# Patient Record
Sex: Male | Born: 1985 | Race: Black or African American | Hispanic: No | Marital: Single | Smoking: Never smoker
Health system: Southern US, Community
[De-identification: ages and names within clinical notes are randomized; demographics above are authoritative.]

---

## 2018-08-20 ENCOUNTER — Encounter (HOSPITAL_COMMUNITY): Payer: Self-pay

## 2018-08-20 ENCOUNTER — Inpatient Hospital Stay (HOSPITAL_COMMUNITY)
Admission: EM | Admit: 2018-08-20 | Discharge: 2018-08-23 | DRG: 194 | Disposition: A | Discharge status: Other Acute Inpatient Hospital | Payer: BC Managed Care – PPO | Attending: Internal Medicine | Admitting: Internal Medicine

## 2018-08-20 ENCOUNTER — Emergency Department (HOSPITAL_COMMUNITY): Payer: BC Managed Care – PPO

## 2018-08-20 ENCOUNTER — Other Ambulatory Visit: Payer: Self-pay

## 2018-08-20 DIAGNOSIS — F4323 Adjustment disorder with mixed anxiety and depressed mood: Secondary | ICD-10-CM | POA: Diagnosis present

## 2018-08-20 DIAGNOSIS — F329 Major depressive disorder, single episode, unspecified: Secondary | ICD-10-CM | POA: Diagnosis present

## 2018-08-20 DIAGNOSIS — W1789XA Other fall from one level to another, initial encounter: Secondary | ICD-10-CM

## 2018-08-20 DIAGNOSIS — J159 Unspecified bacterial pneumonia: Principal | ICD-10-CM | POA: Diagnosis present

## 2018-08-20 DIAGNOSIS — Z1159 Encounter for screening for other viral diseases: Secondary | ICD-10-CM

## 2018-08-20 DIAGNOSIS — F23 Brief psychotic disorder: Secondary | ICD-10-CM | POA: Diagnosis present

## 2018-08-20 DIAGNOSIS — F29 Unspecified psychosis not due to a substance or known physiological condition: Secondary | ICD-10-CM

## 2018-08-20 DIAGNOSIS — Y92008 Other place in unspecified non-institutional (private) residence as the place of occurrence of the external cause: Secondary | ICD-10-CM

## 2018-08-20 DIAGNOSIS — G4700 Insomnia, unspecified: Secondary | ICD-10-CM | POA: Diagnosis present

## 2018-08-20 DIAGNOSIS — J939 Pneumothorax, unspecified: Secondary | ICD-10-CM | POA: Diagnosis present

## 2018-08-20 DIAGNOSIS — J982 Interstitial emphysema: Secondary | ICD-10-CM

## 2018-08-20 DIAGNOSIS — J189 Pneumonia, unspecified organism: Secondary | ICD-10-CM

## 2018-08-20 DIAGNOSIS — T1491XA Suicide attempt, initial encounter: Secondary | ICD-10-CM | POA: Diagnosis present

## 2018-08-20 DIAGNOSIS — F410 Panic disorder [episodic paroxysmal anxiety] without agoraphobia: Secondary | ICD-10-CM | POA: Diagnosis present

## 2018-08-20 DIAGNOSIS — X80XXXA Intentional self-harm by jumping from a high place, initial encounter: Secondary | ICD-10-CM | POA: Diagnosis present

## 2018-08-20 LAB — URINALYSIS, ROUTINE W REFLEX MICROSCOPIC
Bacteria, UA: NONE SEEN
Bilirubin Urine: NEGATIVE
Glucose, UA: NEGATIVE mg/dL
Ketones, ur: 5 mg/dL — AB
Leukocytes,Ua: NEGATIVE
Nitrite: NEGATIVE
Protein, ur: 30 mg/dL — AB
Specific Gravity, Urine: >1.046 — ABNORMAL HIGH (ref 1.005–1.030)
pH: 6 (ref 5.0–8.0)

## 2018-08-20 LAB — COMPREHENSIVE METABOLIC PANEL
ALT: 75 U/L — ABNORMAL HIGH (ref 0–44)
AST: 59 U/L — ABNORMAL HIGH (ref 15–41)
Albumin: 3.3 g/dL — ABNORMAL LOW (ref 3.5–5.0)
Alkaline Phosphatase: 52 U/L (ref 38–126)
Anion gap: 14 (ref 5–15)
BUN: 12 mg/dL (ref 6–20)
CO2: 20 mmol/L — ABNORMAL LOW (ref 22–32)
Calcium: 8.6 mg/dL — ABNORMAL LOW (ref 8.9–10.3)
Chloride: 101 mmol/L (ref 98–111)
Creatinine, Ser: 1.24 mg/dL (ref 0.61–1.24)
GFR calc Af Amer: >60 mL/min (ref >60)
GFR calc non Af Amer: >60 mL/min (ref >60)
Glucose, Bld: 131 mg/dL — ABNORMAL HIGH (ref 70–99)
Potassium: 3.7 mmol/L (ref 3.5–5.1)
Sodium: 135 mmol/L (ref 135–145)
Total Bilirubin: 2.9 mg/dL — ABNORMAL HIGH (ref 0.3–1.2)
Total Protein: 7.1 g/dL (ref 6.5–8.1)

## 2018-08-20 LAB — I-STAT CHEM 8, ED
BUN: 14 mg/dL (ref 6–20)
Calcium, Ion: 0.96 mmol/L — ABNORMAL LOW (ref 1.15–1.40)
Chloride: 103 mmol/L (ref 98–111)
Creatinine, Ser: 1.1 mg/dL (ref 0.61–1.24)
Glucose, Bld: 132 mg/dL — ABNORMAL HIGH (ref 70–99)
HCT: 35 % — ABNORMAL LOW (ref 39.0–52.0)
Hemoglobin: 11.9 g/dL — ABNORMAL LOW (ref 13.0–17.0)
Potassium: 3.6 mmol/L (ref 3.5–5.1)
Sodium: 134 mmol/L — ABNORMAL LOW (ref 135–145)
TCO2: 22 mmol/L (ref 22–32)

## 2018-08-20 LAB — RAPID HIV SCREEN (HIV 1/2 AB+AG)
HIV 1/2 Antibodies: NONREACTIVE
HIV-1 P24 Antigen - HIV24: NONREACTIVE

## 2018-08-20 LAB — PROTIME-INR
INR: 1.2 (ref 0.8–1.2)
Prothrombin Time: 15 seconds (ref 11.4–15.2)

## 2018-08-20 LAB — CBC
HCT: 32.9 % — ABNORMAL LOW (ref 39.0–52.0)
Hemoglobin: 10.6 g/dL — ABNORMAL LOW (ref 13.0–17.0)
MCH: 23.5 pg — ABNORMAL LOW (ref 26.0–34.0)
MCHC: 32.2 g/dL (ref 30.0–36.0)
MCV: 72.9 fL — ABNORMAL LOW (ref 80.0–100.0)
Platelets: 365 10*3/uL (ref 150–400)
RBC: 4.51 MIL/uL (ref 4.22–5.81)
RDW: 12.2 % (ref 11.5–15.5)
WBC: 12.7 10*3/uL — ABNORMAL HIGH (ref 4.0–10.5)
nRBC: 0 % (ref 0.0–0.2)

## 2018-08-20 LAB — SAMPLE TO BLOOD BANK

## 2018-08-20 LAB — RAPID URINE DRUG SCREEN, HOSP PERFORMED
Amphetamines: NOT DETECTED
Barbiturates: NOT DETECTED
Benzodiazepines: POSITIVE — AB
Cocaine: NOT DETECTED
Opiates: NOT DETECTED
Tetrahydrocannabinol: NOT DETECTED

## 2018-08-20 LAB — LACTIC ACID, PLASMA: Lactic Acid, Venous: 1.7 mmol/L (ref 0.5–1.9)

## 2018-08-20 LAB — ETHANOL: Alcohol, Ethyl (B): <10 mg/dL (ref <10)

## 2018-08-20 LAB — ACETAMINOPHEN LEVEL: Acetaminophen (Tylenol), Serum: <10 ug/mL — ABNORMAL LOW (ref 10–30)

## 2018-08-20 LAB — SALICYLATE LEVEL: Salicylate Lvl: <7 mg/dL (ref 2.8–30.0)

## 2018-08-20 LAB — SARS CORONAVIRUS 2 BY RT PCR (HOSPITAL ORDER, PERFORMED IN ~~LOC~~ HOSPITAL LAB): SARS Coronavirus 2: NEGATIVE

## 2018-08-20 MED ORDER — SODIUM CHLORIDE 0.9 % IV SOLN
1.0000 g | Freq: Once | INTRAVENOUS | Status: AC
  Administered 2018-08-20: 1 g via INTRAVENOUS
  Filled 2018-08-20: qty 10

## 2018-08-20 MED ORDER — SODIUM CHLORIDE 0.9 % IV SOLN
500.0000 mg | Freq: Once | INTRAVENOUS | Status: AC
  Administered 2018-08-20: 500 mg via INTRAVENOUS
  Filled 2018-08-20: qty 500

## 2018-08-20 MED ORDER — ENOXAPARIN SODIUM 40 MG/0.4ML ~~LOC~~ SOLN
40.0000 mg | SUBCUTANEOUS | Status: DC
  Administered 2018-08-22: 40 mg via SUBCUTANEOUS
  Filled 2018-08-20: qty 0.4

## 2018-08-20 MED ORDER — SODIUM CHLORIDE 0.9 % IV BOLUS
1000.0000 mL | Freq: Once | INTRAVENOUS | Status: AC
  Administered 2018-08-20: 1000 mL via INTRAVENOUS

## 2018-08-20 MED ORDER — IOHEXOL 300 MG/ML  SOLN
100.0000 mL | Freq: Once | INTRAMUSCULAR | Status: AC | PRN
  Administered 2018-08-20: 100 mL via INTRAVENOUS

## 2018-08-20 MED ORDER — ACETAMINOPHEN 650 MG RE SUPP: 650.0000 mg | Freq: Four times a day (QID) | RECTAL | Status: DC | PRN

## 2018-08-20 MED ORDER — STERILE WATER FOR INJECTION IJ SOLN
INTRAMUSCULAR | Status: AC
  Administered 2018-08-20: 1 mL
  Filled 2018-08-20: qty 10

## 2018-08-20 MED ORDER — ZIPRASIDONE MESYLATE 20 MG IM SOLR
20.0000 mg | Freq: Once | INTRAMUSCULAR | Status: AC
  Administered 2018-08-20: 20 mg via INTRAMUSCULAR
  Filled 2018-08-20: qty 20

## 2018-08-20 MED ORDER — DIPHENHYDRAMINE HCL 25 MG PO TABS: 50.0000 mg | ORAL_TABLET | Freq: Every evening | ORAL | Status: DC | PRN

## 2018-08-20 MED ORDER — SODIUM CHLORIDE 0.9% FLUSH
3.0000 mL | Freq: Two times a day (BID) | INTRAVENOUS | Status: DC
  Administered 2018-08-20 – 2018-08-22 (×4): 3 mL via INTRAVENOUS

## 2018-08-20 MED ORDER — ACETAMINOPHEN 325 MG PO TABS: 650.0000 mg | ORAL_TABLET | Freq: Four times a day (QID) | ORAL | Status: DC | PRN

## 2018-08-20 NOTE — ED Notes (Signed)
Please call mom Tayvien Kane at [phone] to give her an update on patient

## 2018-08-20 NOTE — ED Notes (Signed)
Called staffing for sitter. Kennon Holter is said to arrive around 1900

## 2018-08-20 NOTE — ED Notes (Signed)
ED TO INPATIENT HANDOFF REPORT  ED Nurse Name and Phone #: [phone]  S Name/Age/Gender Richard [name] 33 y.o. male Room/Bed: TRAAC/TRAAC  Code Status   Code Status: Full Code  Home/SNF/Other Home Patient oriented to: self, place, time and situation Is this baseline? Yes   Triage Complete: Triage complete  Chief Complaint Level 2  Triage Note No notes on file   Allergies No Known Allergies  Level of Care/Admitting Diagnosis ED Disposition    ED Disposition Condition Comment   Admit  Hospital Area: [facility] [[id number]]  Level of Care: Telemetry Medical [104]  Covid Evaluation: Confirmed COVID Negative  Diagnosis: Pneumonia [227785]  Admitting Physician: Inez Catalina [[id number]]  Attending Physician: Inez Catalina [[id number]]  PT Class (Do Not Modify): Observation [104]  PT Acc Code (Do Not Modify): Observation [10022]       B Medical/Surgery History No past medical history on file.    A IV Location/Drains/Wounds Patient Lines/Drains/Airways Status   Active Line/Drains/Airways    Name:   Placement date:   Placement time:   Site:   Days:   Peripheral IV 08/20/18 Left Hand   08/20/18    -    Hand   less than 1          Intake/Output Last 24 hours  Intake/Output Summary (Last 24 hours) at 08/20/2018 1930 Last data filed at 08/20/2018 1918 Gross per 24 hour  Intake 100 ml  Output 250 ml  Net -150 ml    Labs/Imaging Results for orders placed or performed during the hospital encounter of 08/20/18 (from the past 48 hour(s))  Comprehensive metabolic panel     Status: Abnormal   Collection Time: 08/20/18 12:44 PM  Result Value Ref Range   Sodium 135 135 - 145 mmol/L   Potassium 3.7 3.5 - 5.1 mmol/L   Chloride 101 98 - 111 mmol/L   CO2 20 (L) 22 - 32 mmol/L   Glucose, Bld 131 (H) 70 - 99 mg/dL   BUN 12 6 - 20 mg/dL   Creatinine, Ser 1.91 0.61 - 1.24 mg/dL   Calcium 8.6 (L) 8.9 - 10.3 mg/dL   Total Protein 7.1 6.5 - 8.1 g/dL   Albumin 3.3  (L) 3.5 - 5.0 g/dL   AST 59 (H) 15 - 41 U/L   ALT 75 (H) 0 - 44 U/L   Alkaline Phosphatase 52 38 - 126 U/L   Total Bilirubin 2.9 (H) 0.3 - 1.2 mg/dL   GFR calc non Af Amer >60 >60 mL/min   GFR calc Af Amer >60 >60 mL/min   Anion gap 14 5 - 15    Comment: Performed at [facility], [address] [place], [place] [address]  CBC     Status: Abnormal   Collection Time: 08/20/18 12:44 PM  Result Value Ref Range   WBC 12.7 (H) 4.0 - 10.5 K/uL   RBC 4.51 4.22 - 5.81 MIL/uL   Hemoglobin 10.6 (L) 13.0 - 17.0 g/dL   HCT 56.2 (L) 13.0 - 86.5 %   MCV 72.9 (L) 80.0 - 100.0 fL   MCH 23.5 (L) 26.0 - 34.0 pg   MCHC 32.2 30.0 - 36.0 g/dL   RDW 78.4 69.6 - 29.5 %   Platelets 365 150 - 400 K/uL   nRBC 0.0 0.0 - 0.2 %    Comment: Performed at [facility], [address], [place], [place] [address]  Ethanol     Status: None  Collection Time: 08/20/18 12:44 PM  Result Value Ref Range   Alcohol, Ethyl (B) <10 <10 mg/dL    Comment: (NOTE) Lowest detectable limit for serum alcohol is 10 mg/dL. For medical purposes only. Performed at [facility], [address] [place], [place] [address]   Lactic acid, plasma     Status: None   Collection Time: 08/20/18 12:44 PM  Result Value Ref Range   Lactic Acid, Venous 1.7 0.5 - 1.9 mmol/L    Comment: Performed at [facility], [address] [place], [place] [address]  Protime-INR     Status: None   Collection Time: 08/20/18 12:44 PM  Result Value Ref Range   Prothrombin Time 15.0 11.4 - 15.2 seconds   INR 1.2 0.8 - 1.2    Comment: (NOTE) INR goal varies based on device and disease states. Performed at [facility], [address] [place], [place] [address]   Sample to Blood Bank     Status: None   Collection Time: 08/20/18 12:45 PM  Result Value Ref Range   Blood Bank Specimen SAMPLE AVAILABLE FOR TESTING    Sample Expiration      08/21/2018,2359 Performed at [facility], [address]  [place], [place] [address]   I-stat chem 8, ED     Status: Abnormal   Collection Time: 08/20/18 12:51 PM  Result Value Ref Range   Sodium 134 (L) 135 - 145 mmol/L   Potassium 3.6 3.5 - 5.1 mmol/L   Chloride 103 98 - 111 mmol/L   BUN 14 6 - 20 mg/dL    Comment: QA FLAGS AND/OR RANGES MODIFIED BY DEMOGRAPHIC UPDATE ON 07/17 AT 1324   Creatinine, Ser 1.10 0.61 - 1.24 mg/dL   Glucose, Bld 440132 (H) 70 - 99 mg/dL   Calcium, Ion 1.020.96 (L) 1.15 - 1.40 mmol/L   TCO2 22 22 - 32 mmol/L   Hemoglobin 11.9 (L) 13.0 - 17.0 g/dL   HCT 72.535.0 (L) 36.639.0 - 44.052.0 %  Acetaminophen level     Status: Abnormal   Collection Time: 08/20/18  3:06 PM  Result Value Ref Range   Acetaminophen (Tylenol), Serum <10 (L) 10 - 30 ug/mL    Comment: (NOTE) Therapeutic concentrations vary significantly. A range of 10-30 ug/mL  may be an effective concentration for many patients. However, some  are best treated at concentrations outside of this range. Acetaminophen concentrations >150 ug/mL at 4 hours after ingestion  and >50 ug/mL at 12 hours after ingestion are often associated with  toxic reactions. Performed at [facility], [address] [place], [place] [address]   Salicylate level     Status: None   Collection Time: 08/20/18  3:06 PM  Result Value Ref Range   Salicylate Lvl <7.0 2.8 - 30.0 mg/dL    Comment: Performed at [facility], [address] [place], [place] [address]  SARS Coronavirus 2 (CEPHEID - Performed in [facility] lab), Hosp Order     Status: None   Collection Time: 08/20/18  4:16 PM   Specimen: Nasopharyngeal Swab  Result Value Ref Range   SARS Coronavirus 2 NEGATIVE NEGATIVE    Comment: (NOTE) If result is NEGATIVE SARS-CoV-2 target nucleic acids are NOT DETECTED. The SARS-CoV-2 RNA is generally detectable in upper and lower  respiratory specimens during the acute phase of infection. The lowest  concentration of SARS-CoV-2 viral copies this assay can detect is 250  copies /  mL. A negative result does not preclude SARS-CoV-2 infection  and should not be used as the sole basis for treatment or other  patient management decisions.  A negative result may occur with  improper specimen collection / handling, submission of specimen other  than nasopharyngeal swab, presence of viral mutation(s) within the  areas targeted by this assay, and inadequate number of viral copies  (<250 copies / mL). A negative result must be combined with clinical  observations, patient history, and epidemiological information. If result is POSITIVE SARS-CoV-2 target nucleic acids are DETECTED. The SARS-CoV-2 RNA is generally detectable in upper and lower  respiratory specimens dur ing the acute phase of infection.  Positive  results are indicative of active infection with SARS-CoV-2.  Clinical  correlation with patient history and other diagnostic information is  necessary to determine patient infection status.  Positive results do  not rule out bacterial infection or co-infection with other viruses. If result is PRESUMPTIVE POSTIVE SARS-CoV-2 nucleic acids MAY BE PRESENT.   A presumptive positive result was obtained on the submitted specimen  and confirmed on repeat testing.  While 2019 novel coronavirus  (SARS-CoV-2) nucleic acids may be present in the submitted sample  additional confirmatory testing may be necessary for epidemiological  and / or clinical management purposes  to differentiate between  SARS-CoV-2 and other Sarbecovirus currently known to infect humans.  If clinically indicated additional testing with an alternate test  methodology [phone]) is advised. The SARS-CoV-2 RNA is generally  detectable in upper and lower respiratory sp ecimens during the acute  phase of infection. The expected result is Negative. Fact Sheet for Patients:  StrictlyIdeas.no Fact Sheet for Healthcare Providers: BankingDealers.co.za This test is  not yet approved or cleared by the [place] FDA and has been authorized for detection and/or diagnosis of SARS-CoV-2 by FDA under an Emergency Use Authorization (EUA).  This EUA will remain in effect (meaning this test can be used) for the duration of the COVID-19 declaration under Section 564(b)(1) of the Act, 21 U.S.C. section 360bbb-3(b)(1), unless the authorization is terminated or revoked sooner. Performed at [facility], [address], [place], [place] [address]    Ct Head Wo Contrast  Result Date: 08/20/2018 CLINICAL DATA:  Cervical spine injury, ligament injury suspected. Suicide attempt, fall from second story balcony EXAM: CT HEAD WITHOUT CONTRAST CT CERVICAL SPINE WITHOUT CONTRAST TECHNIQUE: Multidetector CT imaging of the head and cervical spine was performed following the standard protocol without intravenous contrast. Multiplanar CT image reconstructions of the cervical spine were also generated. COMPARISON:  None. FINDINGS: CT HEAD FINDINGS Brain: No evidence of acute infarction, hemorrhage, hydrocephalus, extra-axial collection or mass lesion/mass effect. Vascular: No hyperdense vessel or unexpected calcification. Skull: Prominent appearance of the lambdoid sutures is symmetric bilaterally without adjacent stranding or other features suggest diastasis. No definite calvarial fracture or suspicious osseous lesion. No scalp swelling or hematoma. Sinuses/Orbits: Nodular thickening right maxillary sinus paranasal sinuses and mastoid air cells are otherwise predominantly clear. And Orbital structures are unremarkable. Other: None None CT CERVICAL SPINE FINDINGS Alignment: Straightening of the normal cervical lordosis with slight reversal centered at C5. No traumatic listhesis. Skull base and vertebrae: No acute fracture. No primary bone lesion or focal pathologic process. Soft tissues and spinal canal: No pre or paravertebral fluid or swelling. No visible canal hematoma. Disc  levels: Disc spaces are well preserved. No significant spinal canal or foraminal stenosis. Upper chest: Scattered foci of upper mediastinal gas. No visible tracheal or  esophageal injury. Other: None. IMPRESSION: No acute intracranial abnormality. No calvarial fracture or scalp hematoma. No acute cervical spine fracture or traumatic listhesis. Cervical stabilization collar in place. Scattered foci of upper mediastinal gas, for further details see dedicated chest CT. Electronically Signed   By: Kreg ShropshirePrice  DeHay M.D.   On: 08/20/2018 15:13   Ct Chest W Contrast  Result Date: 08/20/2018 CLINICAL DATA:  Blunt abdominal trauma, suicide attempt, fall from second story balcony. Denies injuries, pain. EXAM: CT CHEST AND ABDOMEN WITH CONTRAST TECHNIQUE: Multidetector CT imaging of the chest and abdomen was performed following the standard protocol during bolus administration of intravenous contrast. CONTRAST:  100mL OMNIPAQUE IOHEXOL 300 MG/ML  SOLN COMPARISON:  None. FINDINGS: CT CHEST FINDINGS Cardiovascular: Evaluation of the aortic root is limited due to cardiac pulsation artifact. The aorta is normal caliber. No intramural hematoma, dissection flap or other luminal abnormality of the aorta is seen. No periaortic stranding or hemorrhage. Normal heart size. No pericardial effusion. Mediastinum/Nodes: Scattered foci of upper and middle mediastinal gas with air also seen tracking into the right hilum along the right mainstem bronchus. No abnormal esophageal thickening to suggest a site of esophageal injury. Thyroid is unremarkable. No enlarged mediastinal or axillary lymph nodes. Lungs/Pleura: Multifocal areas of airspace consolidation with a basilar and largely peripheral predominance. There is gas tracking along the right major fissure as well as a trace left pneumothorax seen medially as well. No pleural fluid. Musculoskeletal: No acute osseous abnormality or suspicious osseous lesion. Mild gynecomastia. No chest wall  contusion CT ABDOMEN FINDINGS Hepatobiliary: No focal liver abnormality is seen. No hepatic injury. No perihepatic hematoma. No gallstones, gallbladder wall thickening, or biliary dilatation. Pancreas: Unremarkable. No pancreatic ductal dilatation or surrounding inflammatory changes. Spleen: Normal in size without focal abnormality. No direct splenic injury or perisplenic hematoma. Adrenals/Urinary Tract: No adrenal hemorrhage or renal injury identified. Bladder is unremarkable. Stomach/Bowel: Distal esophagus, stomach and duodenal sweep are unremarkable. No bowel wall thickening or dilatation. No evidence of obstruction. Scattered colonic diverticula without focal pericolonic inflammation to suggest diverticulitis. A normal appendix is visualized. No mesenteric hematoma or active contrast extravasation. Vascular/Lymphatic: The aorta is normal caliber. No aortic or other major vascular injury identified. Other: No abdominopelvic free fluid or free gas. No bowel containing hernias. No body wall contusion or hematoma. No traumatic abdominal wall hernia. Musculoskeletal: No acute osseous abnormality or suspicious osseous lesion. 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 IMPRESSION: 1. Multifocal areas of airspace consolidation with a basilar and largely peripheral predominance. Appearance and distribution is not entirely typical for pulmonary contusion and should consider assessment for infectious symptoms/underlying pneumonia. 2. Nonspecific pneumomediastinum and trace left pneumothorax. Similarly while these could be posttraumatic and arise in the setting of blunt thoracic trauma, may also arise spontaneously in the setting of persistent cough/infection. No CT evidence of direct esophageal or tracheal injury. 3. No other evidence of acute traumatic injury within the chest, abdomen, or pelvis. These results were called by telephone at the time of interpretation on 08/20/2018 at 3:33 pm to Dr.  Particia NearingHaviland, who verbally acknowledged these results. Electronically Signed   By: Kreg ShropshirePrice  DeHay M.D.   On: 08/20/2018 15:34   Ct Cervical Spine Wo Contrast  Result Date: 08/20/2018 CLINICAL DATA:  Cervical spine injury, ligament injury suspected. Suicide attempt, fall from second story balcony EXAM: CT HEAD WITHOUT CONTRAST CT CERVICAL SPINE WITHOUT CONTRAST TECHNIQUE: Multidetector CT imaging of the head and cervical spine was performed following the standard protocol  without intravenous contrast. Multiplanar CT image reconstructions of the cervical spine were also generated. COMPARISON:  None. FINDINGS: CT HEAD FINDINGS Brain: No evidence of acute infarction, hemorrhage, hydrocephalus, extra-axial collection or mass lesion/mass effect. Vascular: No hyperdense vessel or unexpected calcification. Skull: Prominent appearance of the lambdoid sutures is symmetric bilaterally without adjacent stranding or other features suggest diastasis. No definite calvarial fracture or suspicious osseous lesion. No scalp swelling or hematoma. Sinuses/Orbits: Nodular thickening right maxillary sinus paranasal sinuses and mastoid air cells are otherwise predominantly clear. And Orbital structures are unremarkable. Other: None None CT CERVICAL SPINE FINDINGS Alignment: Straightening of the normal cervical lordosis with slight reversal centered at C5. No traumatic listhesis. Skull base and vertebrae: No acute fracture. No primary bone lesion or focal pathologic process. Soft tissues and spinal canal: No pre or paravertebral fluid or swelling. No visible canal hematoma. Disc levels: Disc spaces are well preserved. No significant spinal canal or foraminal stenosis. Upper chest: Scattered foci of upper mediastinal gas. No visible tracheal or esophageal injury. Other: None. IMPRESSION: No acute intracranial abnormality. No calvarial fracture or scalp hematoma. No acute cervical spine fracture or traumatic listhesis. Cervical stabilization  collar in place. Scattered foci of upper mediastinal gas, for further details see dedicated chest CT. Electronically Signed   By: Kreg ShropshirePrice  DeHay M.D.   On: 08/20/2018 15:13   Ct Abdomen Pelvis W Contrast  Result Date: 08/20/2018 CLINICAL DATA:  Blunt abdominal trauma, suicide attempt, fall from second story balcony. Denies injuries, pain. EXAM: CT CHEST AND ABDOMEN WITH CONTRAST TECHNIQUE: Multidetector CT imaging of the chest and abdomen was performed following the standard protocol during bolus administration of intravenous contrast. CONTRAST:  100mL OMNIPAQUE IOHEXOL 300 MG/ML  SOLN COMPARISON:  None. FINDINGS: CT CHEST FINDINGS Cardiovascular: Evaluation of the aortic root is limited due to cardiac pulsation artifact. The aorta is normal caliber. No intramural hematoma, dissection flap or other luminal abnormality of the aorta is seen. No periaortic stranding or hemorrhage. Normal heart size. No pericardial effusion. Mediastinum/Nodes: Scattered foci of upper and middle mediastinal gas with air also seen tracking into the right hilum along the right mainstem bronchus. No abnormal esophageal thickening to suggest a site of esophageal injury. Thyroid is unremarkable. No enlarged mediastinal or axillary lymph nodes. Lungs/Pleura: Multifocal areas of airspace consolidation with a basilar and largely peripheral predominance. There is gas tracking along the right major fissure as well as a trace left pneumothorax seen medially as well. No pleural fluid. Musculoskeletal: No acute osseous abnormality or suspicious osseous lesion. Mild gynecomastia. No chest wall contusion CT ABDOMEN FINDINGS Hepatobiliary: No focal liver abnormality is seen. No hepatic injury. No perihepatic hematoma. No gallstones, gallbladder wall thickening, or biliary dilatation. Pancreas: Unremarkable. No pancreatic ductal dilatation or surrounding inflammatory changes. Spleen: Normal in size without focal abnormality. No direct splenic injury or  perisplenic hematoma. Adrenals/Urinary Tract: No adrenal hemorrhage or renal injury identified. Bladder is unremarkable. Stomach/Bowel: Distal esophagus, stomach and duodenal sweep are unremarkable. No bowel wall thickening or dilatation. No evidence of obstruction. Scattered colonic diverticula without focal pericolonic inflammation to suggest diverticulitis. A normal appendix is visualized. No mesenteric hematoma or active contrast extravasation. Vascular/Lymphatic: The aorta is normal caliber. No aortic or other major vascular injury identified. Other: No abdominopelvic free fluid or free gas. No bowel containing hernias. No body wall contusion or hematoma. No traumatic abdominal wall hernia. Musculoskeletal: No acute osseous abnormality or suspicious osseous lesion. 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 IMPRESSION: 1. Multifocal  areas of airspace consolidation with a basilar and largely peripheral predominance. Appearance and distribution is not entirely typical for pulmonary contusion and should consider assessment for infectious symptoms/underlying pneumonia. 2. Nonspecific pneumomediastinum and trace left pneumothorax. Similarly while these could be posttraumatic and arise in the setting of blunt thoracic trauma, may also arise spontaneously in the setting of persistent cough/infection. No CT evidence of direct esophageal or tracheal injury. 3. No other evidence of acute traumatic injury within the chest, abdomen, or pelvis. These results were called by telephone at the time of interpretation on 08/20/2018 at 3:33 pm to Dr. Particia Nearing, who verbally acknowledged these results. Electronically Signed   By: Kreg Shropshire M.D.   On: 08/20/2018 15:34   Dg Pelvis Portable  Result Date: 08/20/2018 CLINICAL DATA:  Fall. EXAM: PORTABLE PELVIS 1-2 VIEWS COMPARISON:  None. FINDINGS: There is no evidence of pelvic fracture or diastasis. No pelvic bone lesions are seen. IMPRESSION: Negative.  Electronically Signed   By: Elberta Fortis M.D.   On: 08/20/2018 13:33   Dg Chest Port 1 View  Result Date: 08/20/2018 CLINICAL DATA:  Fall.  Tachycardia and chest pain. EXAM: PORTABLE CHEST 1 VIEW COMPARISON:  None. FINDINGS: The heart size and mediastinal contours are within normal limits. Both lungs are clear. The visualized skeletal structures are unremarkable. Question of slight asymmetric density on the left compared to the right probably technical in nature. IMPRESSION: No active disease. Electronically Signed   By: Paulina Fusi M.D.   On: 08/20/2018 13:33    Pending Labs Unresulted Labs (From admission, onward)    Start     Ordered   08/27/18 0500  Creatinine, serum  (enoxaparin (LOVENOX)    CrCl >/= 30 ml/min)  Weekly,   R    Comments: while on enoxaparin therapy    08/20/18 1906   08/21/18 0500  Comprehensive metabolic panel  Tomorrow morning,   R     08/20/18 1906   08/21/18 0500  CBC  Tomorrow morning,   R     08/20/18 1906   08/20/18 1918  Urine culture  ONCE - STAT,   STAT     08/20/18 1918   08/20/18 1906  Urinalysis, Routine w reflex microscopic  Once,   STAT     08/20/18 1906   08/20/18 1752  Rapid urine drug screen (hospital performed)  ONCE - STAT,   STAT     08/20/18 1751   08/20/18 1750  Rapid HIV screen (HIV 1/2 Ab+Ag)  Once,   STAT     08/20/18 1749   08/20/18 1607  Culture, blood (routine x 2)  BLOOD CULTURE X 2,   STAT     08/20/18 1606   08/20/18 1310  Urinalysis, Routine w reflex microscopic  Once,   STAT     08/20/18 1310   08/20/18 1244  CDS serology  (Trauma Panel)  Once,   STAT     08/20/18 1247          Vitals/Pain Today's Vitals   08/20/18 1800 08/20/18 1900 08/20/18 1917 08/20/18 1925  BP:  (!) 134/92    Pulse: (!) 101 (!) 109 98 91  Resp: (!) 28  Temp:      TempSrc:      SpO2: 98% 98% 98% 97%  Weight:      Height:      PainSc:        Isolation Precautions No active isolations  Medications Medications  azithromycin  (ZITHROMAX) 500  mg in sodium chloride 0.9 % 250 mL IVPB (500 mg Intravenous New Bag/Given 08/20/18 1856)  enoxaparin (LOVENOX) injection 40 mg (has no administration in time range)  sodium chloride flush (NS) 0.9 % injection 3 mL (has no administration in time range)  acetaminophen (TYLENOL) tablet 650 mg (has no administration in time range)    Or  acetaminophen (TYLENOL) suppository 650 mg (has no administration in time range)  ziprasidone (GEODON) injection 20 mg (20 mg Intramuscular Given 08/20/18 1252)  sterile water (preservative free) injection (1 mL  Given 08/20/18 1317)  iohexol (OMNIPAQUE) 300 MG/ML solution 100 mL (100 mLs Intravenous Contrast Given 08/20/18 1457)  sodium chloride 0.9 % bolus 1,000 mL (1,000 mLs Intravenous New Bag/Given 08/20/18 1911)  cefTRIAXone (ROCEPHIN) 1 g in sodium chloride 0.9 % 100 mL IVPB (0 g Intravenous Stopped 08/20/18 1905)  sodium chloride 0.9 % bolus 1,000 mL (1,000 mLs Intravenous New Bag/Given 08/20/18 1910)    Mobility walks     Focused Assessments Neuro Assessment Handoff:  Swallow screen pass? n/a         Neuro Assessment:   Neuro Checks:      Last Documented NIHSS Modified Score:   Has TPA been given? No If patient is a Neuro Trauma and patient is going to OR before floor call report to 4N Charge nurse: [phone] or [phone]     R Recommendations: See Admitting Provider Note  Report given to:   Additional Notes:

## 2018-08-20 NOTE — ED Notes (Signed)
Pt to CT. Remains calm.

## 2018-08-20 NOTE — ED Notes (Signed)
Family at beside. Family given emotional support. 

## 2018-08-20 NOTE — Consult Note (Signed)
Activation and Reason: Non level trauma consult, jump from 2nd story  Primary Survey:  Airway: intact, talking Breathing: bilateral coarse bs Circulation: palpable pulses in all 4 ext Disability: GCS 15  Richard [name] is an 33 y.o. male.  HPI: 33yoM with hx of active pneumonia (COVID negative test today) - presented as non level trauma following jump from his 2nd floor bedroom window - attention seeking behavior he reports - he tells me that he did not want to kill himself today but wanted help. EDP notes acute psychosis on his initial evaluation however. Details remain unclear. Patient reports he intentionally took OTC medications (unclear what) earlier this week with suicidal thoughts - these bothered him. He reports he wanted help but people he lives with were concerned about financial aspect of this so he jumped out of his window today so he could "get to the hospital." He denies LOC. Ambulatory on scene. He currently denies any pain anywhere in his body, including head, neck, back, any extremity, chest, abdomen, pelvis.  Reports fevers at home over past couple of days as well as cough.   PMH: Denies any prior medical hx  FHx: Denies fhx of malignancy  Social:   No past medical history on file.  No family history on file.  Social History:  has no history on file for tobacco, alcohol, and drug.  Allergies: No Known Allergies  Medications: I have reviewed the patient's current medications.  Results for orders placed or performed during the hospital encounter of 08/20/18 (from the past 48 hour(s))  Comprehensive metabolic panel     Status: Abnormal   Collection Time: 08/20/18 12:44 PM  Result Value Ref Range   Sodium 135 135 - 145 mmol/L   Potassium 3.7 3.5 - 5.1 mmol/L   Chloride 101 98 - 111 mmol/L   CO2 20 (L) 22 - 32 mmol/L   Glucose, Bld 131 (H) 70 - 99 mg/dL   BUN 12 6 - 20 mg/dL   Creatinine, Ser 4.091.24 0.61 - 1.24 mg/dL   Calcium 8.6 (L) 8.9 - 10.3 mg/dL   Total  Protein 7.1 6.5 - 8.1 g/dL   Albumin 3.3 (L) 3.5 - 5.0 g/dL   AST 59 (H) 15 - 41 U/L   ALT 75 (H) 0 - 44 U/L   Alkaline Phosphatase 52 38 - 126 U/L   Total Bilirubin 2.9 (H) 0.3 - 1.2 mg/dL   GFR calc non Af Amer >60 >60 mL/min   GFR calc Af Amer >60 >60 mL/min   Anion gap 14 5 - 15    Comment: Performed at [facility], [address] [place], [place] [address]  CBC     Status: Abnormal   Collection Time: 08/20/18 12:44 PM  Result Value Ref Range   WBC 12.7 (H) 4.0 - 10.5 K/uL   RBC 4.51 4.22 - 5.81 MIL/uL   Hemoglobin 10.6 (L) 13.0 - 17.0 g/dL   HCT 47.832.9 (L) 29.539.0 - 62.152.0 %   MCV 72.9 (L) 80.0 - 100.0 fL   MCH 23.5 (L) 26.0 - 34.0 pg   MCHC 32.2 30.0 - 36.0 g/dL   RDW 30.812.2 65.711.5 - 84.615.5 %   Platelets 365 150 - 400 K/uL   nRBC 0.0 0.0 - 0.2 %    Comment: Performed at [facility], [address] [place], [place] [address]  Ethanol     Status: None   Collection Time: 08/20/18 12:44 PM  Result Value Ref Range  Alcohol, Ethyl (B) <10 <10 mg/dL    Comment: (NOTE) Lowest detectable limit for serum alcohol is 10 mg/dL. For medical purposes only. Performed at [facility], [address], [place], [place] [address]   Lactic acid, plasma     Status: None   Collection Time: 08/20/18 12:44 PM  Result Value Ref Range   Lactic Acid, Venous 1.7 0.5 - 1.9 mmol/L    Comment: Performed at [facility], [address], [place], [place] [address]  Protime-INR     Status: None   Collection Time: 08/20/18 12:44 PM  Result Value Ref Range   Prothrombin Time 15.0 11.4 - 15.2 seconds   INR 1.2 0.8 - 1.2    Comment: (NOTE) INR goal varies based on device and disease states. Performed at [facility], [address], [place], [place] [address]   Sample to Blood Bank     Status: None   Collection Time: 08/20/18 12:45 PM  Result Value Ref Range   Blood Bank Specimen SAMPLE AVAILABLE FOR TESTING    Sample Expiration      08/21/2018,2359 Performed at [facility], [address], [place], [place] [address]   I-stat chem 8, ED     Status: Abnormal   Collection Time: 08/20/18 12:51 PM  Result Value Ref Range   Sodium 134 (L) 135 - 145 mmol/L   Potassium 3.6 3.5 - 5.1 mmol/L   Chloride 103 98 - 111 mmol/L   BUN 14 6 - 20 mg/dL    Comment: QA FLAGS AND/OR RANGES MODIFIED BY DEMOGRAPHIC UPDATE ON 07/17 AT 1324   Creatinine, Ser 1.10 0.61 - 1.24 mg/dL   Glucose, Bld 295 (H) 70 - 99 mg/dL   Calcium, Ion 6.21 (L) 1.15 - 1.40 mmol/L   TCO2 22 22 - 32 mmol/L   Hemoglobin 11.9 (L) 13.0 - 17.0 g/dL   HCT 30.8 (L) 65.7 - 84.6 %  Acetaminophen level     Status: Abnormal   Collection Time: 08/20/18  3:06 PM  Result Value Ref Range   Acetaminophen (Tylenol), Serum <10 (L) 10 - 30 ug/mL    Comment: (NOTE) Therapeutic concentrations vary significantly. A range of 10-30 ug/mL  may be an effective concentration for many patients. However, some  are best treated at concentrations outside of this range. Acetaminophen concentrations >150 ug/mL at 4 hours after ingestion  and >50 ug/mL at 12 hours after ingestion are often associated with  toxic reactions. Performed at [facility], [address], [place], [place] [address]   Salicylate level     Status: None   Collection Time: 08/20/18  3:06 PM  Result Value Ref Range   Salicylate Lvl <7.0 2.8 - 30.0 mg/dL    Comment: Performed at [facility], [address], [place], [place] [address]  SARS Coronavirus 2 (CEPHEID - Performed in [facility] hospital lab), Hosp Order     Status: None   Collection Time: 08/20/18  4:16 PM   Specimen: Nasopharyngeal Swab  Result Value Ref Range   SARS Coronavirus 2 NEGATIVE NEGATIVE    Comment: (NOTE) If result is NEGATIVE SARS-CoV-2 target nucleic acids are NOT DETECTED. The SARS-CoV-2 RNA is generally detectable in upper and lower  respiratory specimens during the acute phase of infection. The lowest  concentration of SARS-CoV-2 viral copies this  assay can detect is 250  copies / mL. A negative result does not preclude SARS-CoV-2 infection  and  should not be used as the sole basis for treatment or other  patient management decisions.  A negative result may occur with  improper specimen collection / handling, submission of specimen other  than nasopharyngeal swab, presence of viral mutation(s) within the  areas targeted by this assay, and inadequate number of viral copies  (<250 copies / mL). A negative result must be combined with clinical  observations, patient history, and epidemiological information. If result is POSITIVE SARS-CoV-2 target nucleic acids are DETECTED. The SARS-CoV-2 RNA is generally detectable in upper and lower  respiratory specimens dur ing the acute phase of infection.  Positive  results are indicative of active infection with SARS-CoV-2.  Clinical  correlation with patient history and other diagnostic information is  necessary to determine patient infection status.  Positive results do  not rule out bacterial infection or co-infection with other viruses. If result is PRESUMPTIVE POSTIVE SARS-CoV-2 nucleic acids MAY BE PRESENT.   A presumptive positive result was obtained on the submitted specimen  and confirmed on repeat testing.  While 2019 novel coronavirus  (SARS-CoV-2) nucleic acids may be present in the submitted sample  additional confirmatory testing may be necessary for epidemiological  and / or clinical management purposes  to differentiate between  SARS-CoV-2 and other Sarbecovirus currently known to infect humans.  If clinically indicated additional testing with an alternate test  methodology [phone]([id number]) is advised. The SARS-CoV-2 RNA is generally  detectable in upper and lower respiratory sp ecimens during the acute  phase of infection. The expected result is Negative. Fact Sheet for Patients:  BoilerBrush.com.cyhttps://www.fda.gov/media/136312/download Fact Sheet for Healthcare  Providers: https://pope.com/https://www.fda.gov/media/136313/download This test is not yet approved or cleared by the [place] FDA and has been authorized for detection and/or diagnosis of SARS-CoV-2 by FDA under an Emergency Use Authorization (EUA).  This EUA will remain in effect (meaning this test can be used) for the duration of the COVID-19 declaration under Section 564(b)(1) of the Act, 21 U.S.C. section 360bbb-3(b)(1), unless the authorization is terminated or revoked sooner. Performed at [facility], [address] [place], [place] [address]     Ct Head Wo Contrast  Result Date: 08/20/2018 CLINICAL DATA:  Cervical spine injury, ligament injury suspected. Suicide attempt, fall from second story balcony EXAM: CT HEAD WITHOUT CONTRAST CT CERVICAL SPINE WITHOUT CONTRAST TECHNIQUE: Multidetector CT imaging of the head and cervical spine was performed following the standard protocol without intravenous contrast. Multiplanar CT image reconstructions of the cervical spine were also generated. COMPARISON:  None. FINDINGS: CT HEAD FINDINGS Brain: No evidence of acute infarction, hemorrhage, hydrocephalus, extra-axial collection or mass lesion/mass effect. Vascular: No hyperdense vessel or unexpected calcification. Skull: Prominent appearance of the lambdoid sutures is symmetric bilaterally without adjacent stranding or other features suggest diastasis. No definite calvarial fracture or suspicious osseous lesion. No scalp swelling or hematoma. Sinuses/Orbits: Nodular thickening right maxillary sinus paranasal sinuses and mastoid air cells are otherwise predominantly clear. And Orbital structures are unremarkable. Other: None None CT CERVICAL SPINE FINDINGS Alignment: Straightening of the normal cervical lordosis with slight reversal centered at C5. No traumatic listhesis. Skull base and vertebrae: No acute fracture. No primary bone lesion or focal pathologic process. Soft tissues and spinal canal: No pre or  paravertebral fluid or swelling. No visible canal hematoma. Disc levels: Disc spaces are well preserved. No significant spinal canal or foraminal stenosis. Upper chest: Scattered foci of upper mediastinal gas. No visible tracheal or esophageal injury. Other: None. IMPRESSION: No acute intracranial abnormality. No  calvarial fracture or scalp hematoma. No acute cervical spine fracture or traumatic listhesis. Cervical stabilization collar in place. Scattered foci of upper mediastinal gas, for further details see dedicated chest CT. Electronically Signed   By: Kreg Shropshire M.D.   On: 08/20/2018 15:13   Ct Chest W Contrast  Result Date: 08/20/2018 CLINICAL DATA:  Blunt abdominal trauma, suicide attempt, fall from second story balcony. Denies injuries, pain. EXAM: CT CHEST AND ABDOMEN WITH CONTRAST TECHNIQUE: Multidetector CT imaging of the chest and abdomen was performed following the standard protocol during bolus administration of intravenous contrast. CONTRAST:  OMNIPAQUE IOHEXOL 300 MG/ML  SOLN COMPARISON:  None. FINDINGS: CT CHEST FINDINGS Cardiovascular: Evaluation of the aortic root is limited due to cardiac pulsation artifact. The aorta is normal caliber. No intramural hematoma, dissection flap or other luminal abnormality of the aorta is seen. No periaortic stranding or hemorrhage. Normal heart size. No pericardial effusion. Mediastinum/Nodes: Scattered foci of upper and middle mediastinal gas with air also seen tracking into the right hilum along the right mainstem bronchus. No abnormal esophageal thickening to suggest a site of esophageal injury. Thyroid is unremarkable. No enlarged mediastinal or axillary lymph nodes. Lungs/Pleura: Multifocal areas of airspace consolidation with a basilar and largely peripheral predominance. There is gas tracking along the right major fissure as well as a trace left pneumothorax seen medially as well. No pleural fluid. Musculoskeletal: No acute osseous abnormality  or suspicious osseous lesion. Mild gynecomastia. No chest wall contusion CT ABDOMEN FINDINGS Hepatobiliary: No focal liver abnormality is seen. No hepatic injury. No perihepatic hematoma. No gallstones, gallbladder wall thickening, or biliary dilatation. Pancreas: Unremarkable. No pancreatic ductal dilatation or surrounding inflammatory changes. Spleen: Normal in size without focal abnormality. No direct splenic injury or perisplenic hematoma. Adrenals/Urinary Tract: No adrenal hemorrhage or renal injury identified. Bladder is unremarkable. Stomach/Bowel: Distal esophagus, stomach and duodenal sweep are unremarkable. No bowel wall thickening or dilatation. No evidence of obstruction. Scattered colonic diverticula without focal pericolonic inflammation to suggest diverticulitis. A normal appendix is visualized. No mesenteric hematoma or active contrast extravasation. Vascular/Lymphatic: The aorta is normal caliber. No aortic or other major vascular injury identified. Other: No abdominopelvic free fluid or free gas. No bowel containing hernias. No body wall contusion or hematoma. No traumatic abdominal wall hernia. Musculoskeletal: No acute osseous abnormality or suspicious osseous lesion. 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 IMPRESSION: 1. Multifocal areas of airspace consolidation with a basilar and largely peripheral predominance. Appearance and distribution is not entirely typical for pulmonary contusion and should consider assessment for infectious symptoms/underlying pneumonia. 2. Nonspecific pneumomediastinum and trace left pneumothorax. Similarly while these could be posttraumatic and arise in the setting of blunt thoracic trauma, may also arise spontaneously in the setting of persistent cough/infection. No CT evidence of direct esophageal or tracheal injury. 3. No other evidence of acute traumatic injury within the chest, abdomen, or pelvis. These results were called by telephone at the  time of interpretation on 08/20/2018 at 3:33 pm to Dr. Particia Nearing, who verbally acknowledged these results. Electronically Signed   By: Kreg Shropshire M.D.   On: 08/20/2018 15:34   Ct Cervical Spine Wo Contrast  Result Date: 08/20/2018 CLINICAL DATA:  Cervical spine injury, ligament injury suspected. Suicide attempt, fall from second story balcony EXAM: CT HEAD WITHOUT CONTRAST CT CERVICAL SPINE WITHOUT CONTRAST TECHNIQUE: Multidetector CT imaging of the head and cervical spine was performed following the standard protocol without intravenous contrast. Multiplanar CT image reconstructions of the cervical  spine were also generated. COMPARISON:  None. FINDINGS: CT HEAD FINDINGS Brain: No evidence of acute infarction, hemorrhage, hydrocephalus, extra-axial collection or mass lesion/mass effect. Vascular: No hyperdense vessel or unexpected calcification. Skull: Prominent appearance of the lambdoid sutures is symmetric bilaterally without adjacent stranding or other features suggest diastasis. No definite calvarial fracture or suspicious osseous lesion. No scalp swelling or hematoma. Sinuses/Orbits: Nodular thickening right maxillary sinus paranasal sinuses and mastoid air cells are otherwise predominantly clear. And Orbital structures are unremarkable. Other: None None CT CERVICAL SPINE FINDINGS Alignment: Straightening of the normal cervical lordosis with slight reversal centered at C5. No traumatic listhesis. Skull base and vertebrae: No acute fracture. No primary bone lesion or focal pathologic process. Soft tissues and spinal canal: No pre or paravertebral fluid or swelling. No visible canal hematoma. Disc levels: Disc spaces are well preserved. No significant spinal canal or foraminal stenosis. Upper chest: Scattered foci of upper mediastinal gas. No visible tracheal or esophageal injury. Other: None. IMPRESSION: No acute intracranial abnormality. No calvarial fracture or scalp hematoma. No acute cervical spine  fracture or traumatic listhesis. Cervical stabilization collar in place. Scattered foci of upper mediastinal gas, for further details see dedicated chest CT. Electronically Signed   By: Kreg ShropshirePrice  DeHay M.D.   On: 08/20/2018 15:13   Ct Abdomen Pelvis W Contrast  Result Date: 08/20/2018 CLINICAL DATA:  Blunt abdominal trauma, suicide attempt, fall from second story balcony. Denies injuries, pain. EXAM: CT CHEST AND ABDOMEN WITH CONTRAST TECHNIQUE: Multidetector CT imaging of the chest and abdomen was performed following the standard protocol during bolus administration of intravenous contrast. CONTRAST:  100mL OMNIPAQUE IOHEXOL 300 MG/ML  SOLN COMPARISON:  None. FINDINGS: CT CHEST FINDINGS Cardiovascular: Evaluation of the aortic root is limited due to cardiac pulsation artifact. The aorta is normal caliber. No intramural hematoma, dissection flap or other luminal abnormality of the aorta is seen. No periaortic stranding or hemorrhage. Normal heart size. No pericardial effusion. Mediastinum/Nodes: Scattered foci of upper and middle mediastinal gas with air also seen tracking into the right hilum along the right mainstem bronchus. No abnormal esophageal thickening to suggest a site of esophageal injury. Thyroid is unremarkable. No enlarged mediastinal or axillary lymph nodes. Lungs/Pleura: Multifocal areas of airspace consolidation with a basilar and largely peripheral predominance. There is gas tracking along the right major fissure as well as a trace left pneumothorax seen medially as well. No pleural fluid. Musculoskeletal: No acute osseous abnormality or suspicious osseous lesion. Mild gynecomastia. No chest wall contusion CT ABDOMEN FINDINGS Hepatobiliary: No focal liver abnormality is seen. No hepatic injury. No perihepatic hematoma. No gallstones, gallbladder wall thickening, or biliary dilatation. Pancreas: Unremarkable. No pancreatic ductal dilatation or surrounding inflammatory changes. Spleen: Normal in  size without focal abnormality. No direct splenic injury or perisplenic hematoma. Adrenals/Urinary Tract: No adrenal hemorrhage or renal injury identified. Bladder is unremarkable. Stomach/Bowel: Distal esophagus, stomach and duodenal sweep are unremarkable. No bowel wall thickening or dilatation. No evidence of obstruction. Scattered colonic diverticula without focal pericolonic inflammation to suggest diverticulitis. A normal appendix is visualized. No mesenteric hematoma or active contrast extravasation. Vascular/Lymphatic: The aorta is normal caliber. No aortic or other major vascular injury identified. Other: No abdominopelvic free fluid or free gas. No bowel containing hernias. No body wall contusion or hematoma. No traumatic abdominal wall hernia. Musculoskeletal: No acute osseous abnormality or suspicious osseous lesion. 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 IMPRESSION: 1. Multifocal areas of airspace consolidation with a basilar and largely peripheral  predominance. Appearance and distribution is not entirely typical for pulmonary contusion and should consider assessment for infectious symptoms/underlying pneumonia. 2. Nonspecific pneumomediastinum and trace left pneumothorax. Similarly while these could be posttraumatic and arise in the setting of blunt thoracic trauma, may also arise spontaneously in the setting of persistent cough/infection. No CT evidence of direct esophageal or tracheal injury. 3. No other evidence of acute traumatic injury within the chest, abdomen, or pelvis. These results were called by telephone at the time of interpretation on 08/20/2018 at 3:33 pm to Dr. Particia Nearing, who verbally acknowledged these results. Electronically Signed   By: Kreg Shropshire M.D.   On: 08/20/2018 15:34   Dg Pelvis Portable  Result Date: 08/20/2018 CLINICAL DATA:  Fall. EXAM: PORTABLE PELVIS 1-2 VIEWS COMPARISON:  None. FINDINGS: There is no evidence of pelvic fracture or diastasis. No  pelvic bone lesions are seen. IMPRESSION: Negative. Electronically Signed   By: Elberta Fortis M.D.   On: 08/20/2018 13:33   Dg Chest Port 1 View  Result Date: 08/20/2018 CLINICAL DATA:  Fall.  Tachycardia and chest pain. EXAM: PORTABLE CHEST 1 VIEW COMPARISON:  None. FINDINGS: The heart size and mediastinal contours are within normal limits. Both lungs are clear. The visualized skeletal structures are unremarkable. Question of slight asymmetric density on the left compared to the right probably technical in nature. IMPRESSION: No active disease. Electronically Signed   By: Paulina Fusi M.D.   On: 08/20/2018 13:33    Review of Systems  Constitutional: Positive for fever. Negative for chills.  HENT: Negative for nosebleeds and sore throat.   Eyes: Negative for blurred vision and double vision.  Respiratory: Positive for cough. Negative for shortness of breath.   Cardiovascular: Negative for chest pain and leg swelling.  Gastrointestinal: Negative for abdominal pain, nausea and vomiting.  Genitourinary: Negative for flank pain and hematuria.  Musculoskeletal: Negative for back pain, joint pain and neck pain.  Skin: Negative for itching and rash.  Neurological: Negative for loss of consciousness and headaches.  Psychiatric/Behavioral: Positive for depression, substance abuse and suicidal ideas.   Blood pressure 117/84, pulse (!) 101, temperature 98.1 F (36.7 C), temperature source Tympanic, resp. rate 18, height 6' (1.829 m), weight 97.5 kg, SpO2 98 %. Physical Exam  Constitutional: He is oriented to person, place, and time. He appears well-developed and well-nourished.  HENT:  Head: Normocephalic and atraumatic.  Right Ear: External ear normal.  Left Ear: External ear normal.  Nose: Nose normal.  Mouth/Throat: Oropharynx is clear and moist.  Eyes: Pupils are equal, round, and reactive to light. Conjunctivae and EOM are normal.  Neck: Normal range of motion. Neck supple.  Cardiovascular:  Normal rate and regular rhythm.  Respiratory: Effort normal. No respiratory distress. He has rales.  GI: Soft. There is no abdominal tenderness. There is no rebound and no guarding.  Musculoskeletal: Normal range of motion.        General: No tenderness or deformity.  Neurological: He is alert and oriented to person, place, and time.  Skin: Skin is warm and dry.  Psychiatric: He has a normal mood and affect. His behavior is normal.     INJURIES IDENTIFIED/Abnormal findings: 1. Multifocal consolidations - most likely 2/2 pneumonia; could be ctx although less likely given lack of any other notable chest trauma such as rib or vertebral fractures 2. Nonspecific pneumomediastinum 3. Occult Left ptx  Plan: -Medicine has admitted as this is most likely 2/2 underlying pneumonia and coughing, however, would plan for repeat CXR  tomorrow AM to ensure stability and no progression of occult ptx -Would additionally recommend psychiatry evaluation; hx of suicidal ideation and self harm  Sharon Mt. Dema Severin, M.D. [facility] 08/20/2018, 6:55 PM

## 2018-08-20 NOTE — ED Provider Notes (Signed)
[facility] Provider Note MRN:  [id number]  Arrival date & time: 08/20/18     Chief Complaint   Fall from height History of Present Illness   Richard [name] is a 33 y.o. year-old male with unknown past medical history presenting to the ED with chief complaint of fall from height.  Trauma alert, reportedly jumped from a two-story building with intention of hurting himself.  Patient is reportedly manic, better.  I was unable to obtain an accurate HPI, PMH, or ROS due to the patient's acute psychosis.  Review of Systems  Positive for trauma, mania.  Patient's Health History   No past medical history on file.    No family history on file.  Social History   Socioeconomic History  . Marital status: Single    Spouse name: Not on file  . Number of children: Not on file  . Years of education: Not on file  . Highest education level: Not on file  Occupational History  . Not on file  Social Needs  . Financial resource strain: Not on file  . Food insecurity    Worry: Not on file    Inability: Not on file  . Transportation needs    Medical: Not on file    Non-medical: Not on file  Tobacco Use  . Smoking status: Not on file  Substance and Sexual Activity  . Alcohol use: Not on file  . Drug use: Not on file  . Sexual activity: Not on file  Lifestyle  . Physical activity    Days per week: Not on file    Minutes per session: Not on file  . Stress: Not on file  Relationships  . Social Musicianconnections    Talks on phone: Not on file    Gets together: Not on file    Attends religious service: Not on file    Active member of club or organization: Not on file    Attends meetings of clubs or organizations: Not on file    Relationship status: Not on file  . Intimate partner violence    Fear of current or ex partner: Not on file    Emotionally abused: Not on file    Physically abused: Not on file    Forced sexual activity: Not on file  Other  Topics Concern  . Not on file  Social History Narrative  . Not on file     Physical Exam  Vital Signs and Nursing Notes reviewed Vitals:   08/20/18 1240  BP: (!) 160/104  Pulse: (!) 115  Resp: (!) 22  Temp: 98.1 F (36.7 C)  SpO2: 95%    CONSTITUTIONAL: Well-appearing, NAD NEURO:  Alert and oriented x 3, no focal deficits EYES:  eyes equal and reactive ENT/NECK:  no LAD, no JVD CARDIO: Regular rate, well-perfused, normal S1 and S2 PULM:  CTAB no wheezing or rhonchi GI/GU:  normal bowel sounds, non-distended, non-tender MSK/SPINE:  No gross deformities, no edema SKIN:  no rash, atraumatic PSYCH: Manic and psychotic speech and behavior  Diagnostic and Interventional Summary    Labs Reviewed  COMPREHENSIVE METABOLIC PANEL - Abnormal; Notable for the following components:      Result Value   CO2 20 (*)    Glucose, Bld 131 (*)    Calcium 8.6 (*)    Albumin 3.3 (*)    AST 59 (*)    ALT 75 (*)    Total Bilirubin 2.9 (*)    All other  components within normal limits  CBC - Abnormal; Notable for the following components:   WBC 12.7 (*)    Hemoglobin 10.6 (*)    HCT 32.9 (*)    MCV 72.9 (*)    MCH 23.5 (*)    All other components within normal limits  I-STAT CHEM 8, ED - Abnormal; Notable for the following components:   Sodium 134 (*)    Glucose, Bld 132 (*)    Calcium, Ion 0.96 (*)    Hemoglobin 11.9 (*)    HCT 35.0 (*)    All other components within normal limits  ETHANOL  LACTIC ACID, PLASMA  PROTIME-INR  CDS SEROLOGY  URINALYSIS, ROUTINE W REFLEX MICROSCOPIC  SAMPLE TO BLOOD BANK    DG Pelvis Portable  Final Result    DG Chest Port 1 View  Final Result    CT HEAD WO CONTRAST    (Results Pending)  CT CERVICAL SPINE WO CONTRAST    (Results Pending)  CT ABDOMEN PELVIS W CONTRAST    (Results Pending)  CT CHEST W CONTRAST    (Results Pending)    Medications  ziprasidone (GEODON) injection 20 mg (20 mg Intramuscular Given 08/20/18 1252)  sterile water  (preservative free) injection (1 mL  Given 08/20/18 1317)     Procedures Critical Care Critical Care Documentation Critical care time provided by me (excluding procedures): 35 minutes  Condition necessitating critical care: Acute psychosis, level 2 trauma  Components of critical care management: reviewing of prior records, laboratory and imaging interpretation, frequent re-examination and reassessment of vital signs, level 2 trauma protocol, administration of intramuscular Geodon.    ED Course and Medical Decision Making  I have reviewed the triage vital signs and the nursing notes.  Pertinent labs & imaging results that were available during my care of the patient were reviewed by me and considered in my medical decision making (see below for details).  Psychotic behavior with suicide attempt in this otherwise seemingly healthy 50-34 year old male, no significant signs of trauma on exam however difficult to exclude given his current psychotic state.  Will obtain imaging and reassess.  Patient will receive 20 mg of Geodon for his behavior.  Still awaiting laboratory and imaging evaluation, after medical clearance will need TTS evaluation and likely psychiatric admission.  Signed out to oncoming provider at shift change.  Barth Kirks. Sedonia Small, [place] [email or web address]  Final Clinical Impressions(s) / ED Diagnoses     ICD-10-CM   1. Suicide attempt ([place])  T14.91XA   2. Fall from height of greater than 3 feet  W17.89XA   3. Psychosis, unspecified psychosis type [facility])  [place]     ED Discharge Orders    None         Maudie Flakes, MD 08/20/18 [phone]

## 2018-08-20 NOTE — ED Provider Notes (Signed)
Pt signed out by Dr. Sedonia Small pending labs and CT scans.  Pt jumped out of a 2nd floor window due to feeling suicidal after something his mom said.  He was initially manic and required geodon when he arrived.  He is now much more calm and is able to give a history.  He said he's had fevers at home.  He has been taking tylenol and ibuprofen for the fever.  He otherwise has no medical problems, takes no meds.  The pt has had a cough.  His CT of his chest scan shows:    IMPRESSION:  1. Multifocal areas of airspace consolidation with a basilar and  largely peripheral predominance. Appearance and distribution is not  entirely typical for pulmonary contusion and should consider  assessment for infectious symptoms/underlying pneumonia.  2. Nonspecific pneumomediastinum and trace left pneumothorax.  Similarly while these could be posttraumatic and arise in the  setting of blunt thoracic trauma, may also arise spontaneously in  the setting of persistent cough/infection. No CT evidence of direct  esophageal or tracheal injury.  3. No other evidence of acute traumatic injury within the chest,  abdomen, or pelvis.   He was checked for covid which was negative.  Pt given rocephin and zithromax iv for CAP.  Pt also requests an hiv test, so that was ordered.  Ptx and pneumomediastinum are likely from the persistent cough.  Pt has no pain to his chest.    Pt d/w Dr. Dema Severin (trauma) who will see him.  Pt d/w IMTS for admission.  Richard [name] was evaluated in Emergency Department on 08/20/2018 for the symptoms described in the history of present illness. He was evaluated in the context of the global COVID-19 pandemic, which necessitated consideration that the patient might be at risk for infection with the SARS-CoV-2 virus that causes COVID-19. Institutional protocols and algorithms that pertain to the evaluation of patients at risk for COVID-19 are in a state of rapid change based on information released by regulatory  bodies including the CDC and federal and state organizations. These policies and algorithms were followed during the patient's care in the ED.  CRITICAL CARE Performed by: Isla Pence   Total critical care time: 30 minutes  Critical care time was exclusive of separately billable procedures and treating other patients.  Critical care was necessary to treat or prevent imminent or life-threatening deterioration.  Critical care was time spent personally by me on the following activities: development of treatment plan with patient and/or surrogate as well as nursing, discussions with consultants, evaluation of patient's response to treatment, examination of patient, obtaining history from patient or surrogate, ordering and performing treatments and interventions, ordering and review of laboratory studies, ordering and review of radiographic studies, pulse oximetry and re-evaluation of patient's condition.   Isla Pence, MD 08/20/18 Richard [name]

## 2018-08-20 NOTE — ED Notes (Signed)
Spoke to pt's mother, Croix Presley on the phone, added her number to his family contact information. Asked the patient if it is okay to keep her updated on his care and he said no.

## 2018-08-20 NOTE — ED Notes (Signed)
Pt denies SI.  States he was panicked and worried about being sick. Appears completely calm and logical.  Dr Gilford Raid notified and does not feel pt needs sitter at this time.

## 2018-08-20 NOTE — Progress Notes (Signed)
The patient is admitted to 2 W 05 with the diagnosis of pneumonia. A & O x 4. Denied any acute pain at this time. Full assessment to epic completed. Patient denies any suicide ideation and intention. Searched patient with another RN and didn't see any contra band on him or his clothe. Will continue to monitor.

## 2018-08-20 NOTE — H&P (Signed)
Date: 08/20/2018               Patient Name:  Richard [name] MRN: [id number]  DOB: [birth year] Age / Sex: 33 y.o., male   PCP: Patient, No Pcp Per         Medical Service: Internal Medicine Teaching Service         Attending Physician: Dr. Jacalyn LefevreHaviland, Julie, MD    First Contact: Dr. Barbaraann FasterSteen Pager: [phone]  Second Contact: Dr. Alinda MoneyMelvin Pager: [phone]       After Hours (After 5p/  First Contact Pager: [phone]  weekends / holidays): Second Contact Pager: [phone]   Chief Complaint: Feeling anxious and recent illness  History of Present Illness: Richard KleinCarl [name] is a 33 year old male with no past medical history who presents after jumping from a two-story balcony.  Patient reports his history started 2 weeks ago when he had a fever of 101 and went to the doctor to be tested for flu + Covid-19. While awaiting his COVID 19 test he was at home for a solid week and said it was difficult on him.  Test were negative however he was giving ibuprofen and Tylenol that day.  He has also lost 2 friends recently.  He saw one of these friends last Saturday and friend passed away in a boating accident on Sunday.  The accumulation of these events were weighing on him.   He endorses a history of panic attacks and said he was having these emotions similar to his panic attacks in the last 2 days.  He has been unable to sleep in a few days and family members recommended for him to take Benadryl. He told his family who lives with him he would like to go to the hospital to be checked out.  His family would not let him leave and he decided to jump from the second story balcony of his home.  When he tried to jump his girlfriend grabbed his leg and when he fell he fell on his back. Patient endorses SI, without a plan. His mother may be storing some of his grandfathers older guns, but he does not own any himself and does not know where these guns are exactly.Denies a history of depression. Denies HI.  Meds:  Current Meds  Medication  Sig  . acetaminophen (TYLENOL) 325 MG tablet Take 975 mg by mouth every 6 (six) hours as needed for headache (pain).  [name]. diphenhydrAMINE (BENADRYL) 25 MG tablet Take 50-75 mg by mouth at bedtime as needed for sleep.  [name]. ibuprofen (ADVIL) 200 MG tablet Take 800 mg by mouth every 6 (six) hours as needed for headache (pain).     Allergies: Allergies as of 08/20/2018  . (No Known Allergies)   No past medical history on file.  Family History: Denies a history of mental illness in his family.  Prediabetes and high blood pressure in father and mother.  He reports his father had colon cancer in mid 8550s.  Social History: Denies tobacco alcohol or any idrug use  Review of Systems: A complete ROS was negative except as per HPI.  Physical Exam: Blood pressure 117/84, pulse (!) 101, temperature 98.1 F (36.7 C), temperature source Tympanic, resp. rate 18, height 6' (1.829 m), weight 97.5 kg, SpO2 98 %. Physical Exam Constitutional:      Appearance: Normal appearance.  Cardiovascular:     Rate and Rhythm: Regular rhythm. Tachycardia present.     Heart sounds: Normal heart sounds. No murmur.  Pulmonary:     Effort: Pulmonary effort is normal. No respiratory distress.     Breath sounds: Rales present. No wheezing or rhonchi.  Abdominal:     General: Bowel sounds are normal.     Palpations: Abdomen is soft.  Musculoskeletal:        General: No swelling, tenderness, deformity or signs of injury.  Skin:    General: Skin is warm and dry.  Neurological:     Mental Status: He is alert.  Psychiatric:        Behavior: Behavior normal.        Thought Content: Thought content normal.     Assessment & Plan by Problem: Active Problems:   * No active hospital problems. *  Is a 33 year old male no past medical history who presents after a reported suicide attempt by jumping from a two-story building.  No fractures or active bleeding seen on the following imaging; CT head,CT cervical spine, CT  abdomen and pelvis.  #SI/Psych  Questionable suicide attempt, patient reports jumping to come to the hospital and family would not allow him to go out the door.  First provider to be acutely psychotic and patient referred received Geodon injection.  On interview patient with a few statements that seemed grandiose, but did not appear acutely psychotic now.  Logical progression through story. He endorses thoughts of SI without plan.  Denies HI.  Mother may have weapons in the home.  Her number is [phone].  Girlfriends [phone]. Drug test 4 salicylates acetaminophen negative.  Ethanol level undetectable.  Lactic acid 1.7.  Slightly elevated AST and ALT.  Will monitor patient overnight and have psychiatry see him.  Patient is seeking help for what he says have been a rough 2 weeks and have him feeling down.  -Pending UDS - Psych consult -UA - CBC CMP Telemetry  Left pneumothorax Trace pneumothorax on CT chest.  Seen by surgery who recommends repeat chest x-ray tomorrow to ensure stability of occult pneumothorax. -Chest x-ray in a.m. -Continue 2 L O2  Pneumonia   Multifocal consolidation with basilar and peripheral predominance.  History of 1 week of fever and nonproductive cough.  Patient was tested for COVID-19 and flu at a clinic and it was negative. Leukocytosis to 12.7.  Afebrile. Patient started on ceftriaxone and azithromycin in the ED for possible pneumonia.   -Blood cultures x2 -Ceftriaxone and azithromycin   DVT: Lovenox Diet: Regular CODE STATUS: Full   Dispo: Admit patient to Observation with expected length of stay less than 2 midnights.  Signed: Tamsen Snider, MD PGY1  [phone]

## 2018-08-21 ENCOUNTER — Observation Stay (HOSPITAL_COMMUNITY): Payer: BC Managed Care – PPO

## 2018-08-21 DIAGNOSIS — Z915 Personal history of self-harm: Secondary | ICD-10-CM

## 2018-08-21 DIAGNOSIS — F4323 Adjustment disorder with mixed anxiety and depressed mood: Secondary | ICD-10-CM | POA: Diagnosis not present

## 2018-08-21 DIAGNOSIS — F419 Anxiety disorder, unspecified: Secondary | ICD-10-CM

## 2018-08-21 DIAGNOSIS — T1491XA Suicide attempt, initial encounter: Secondary | ICD-10-CM

## 2018-08-21 DIAGNOSIS — J189 Pneumonia, unspecified organism: Secondary | ICD-10-CM

## 2018-08-21 DIAGNOSIS — J939 Pneumothorax, unspecified: Secondary | ICD-10-CM

## 2018-08-21 DIAGNOSIS — R825 Elevated urine levels of drugs, medicaments and biological substances: Secondary | ICD-10-CM

## 2018-08-21 DIAGNOSIS — G4700 Insomnia, unspecified: Secondary | ICD-10-CM

## 2018-08-21 DIAGNOSIS — X80XXXA Intentional self-harm by jumping from a high place, initial encounter: Secondary | ICD-10-CM

## 2018-08-21 DIAGNOSIS — R451 Restlessness and agitation: Secondary | ICD-10-CM

## 2018-08-21 LAB — COMPREHENSIVE METABOLIC PANEL
ALT: 76 U/L — ABNORMAL HIGH (ref 0–44)
AST: 58 U/L — ABNORMAL HIGH (ref 15–41)
Albumin: 3 g/dL — ABNORMAL LOW (ref 3.5–5.0)
Alkaline Phosphatase: 51 U/L (ref 38–126)
Anion gap: 11 (ref 5–15)
BUN: 12 mg/dL (ref 6–20)
CO2: 23 mmol/L (ref 22–32)
Calcium: 8.6 mg/dL — ABNORMAL LOW (ref 8.9–10.3)
Chloride: 105 mmol/L (ref 98–111)
Creatinine, Ser: 1.06 mg/dL (ref 0.61–1.24)
GFR calc Af Amer: >60 mL/min (ref >60)
GFR calc non Af Amer: >60 mL/min (ref >60)
Glucose, Bld: 105 mg/dL — ABNORMAL HIGH (ref 70–99)
Potassium: 3.3 mmol/L — ABNORMAL LOW (ref 3.5–5.1)
Sodium: 139 mmol/L (ref 135–145)
Total Bilirubin: 2.2 mg/dL — ABNORMAL HIGH (ref 0.3–1.2)
Total Protein: 6.8 g/dL (ref 6.5–8.1)

## 2018-08-21 LAB — CBC
HCT: 33.7 % — ABNORMAL LOW (ref 39.0–52.0)
HCT: 32.4 % — ABNORMAL LOW (ref 39.0–52.0)
Hemoglobin: 10.5 g/dL — ABNORMAL LOW (ref 13.0–17.0)
Hemoglobin: 10.3 g/dL — ABNORMAL LOW (ref 13.0–17.0)
MCH: 23.2 pg — ABNORMAL LOW (ref 26.0–34.0)
MCH: 23.3 pg — ABNORMAL LOW (ref 26.0–34.0)
MCHC: 31.2 g/dL (ref 30.0–36.0)
MCHC: 31.8 g/dL (ref 30.0–36.0)
MCV: 74.4 fL — ABNORMAL LOW (ref 80.0–100.0)
MCV: 73.1 fL — ABNORMAL LOW (ref 80.0–100.0)
Platelets: 420 10*3/uL — ABNORMAL HIGH (ref 150–400)
Platelets: 414 10*3/uL — ABNORMAL HIGH (ref 150–400)
RBC: 4.53 MIL/uL (ref 4.22–5.81)
RBC: 4.43 MIL/uL (ref 4.22–5.81)
RDW: 12.4 % (ref 11.5–15.5)
RDW: 12.2 % (ref 11.5–15.5)
WBC: 7.6 10*3/uL (ref 4.0–10.5)
WBC: 8.6 10*3/uL (ref 4.0–10.5)
nRBC: 0 % (ref 0.0–0.2)
nRBC: 0 % (ref 0.0–0.2)

## 2018-08-21 LAB — DIFFERENTIAL
Abs Immature Granulocytes: 0 10*3/uL (ref 0.00–0.07)
Basophils Absolute: 0.1 10*3/uL (ref 0.0–0.1)
Basophils Relative: 1 %
Eosinophils Absolute: 0 10*3/uL (ref 0.0–0.5)
Eosinophils Relative: 0 %
Lymphocytes Relative: 4 %
Lymphs Abs: 0.3 10*3/uL — ABNORMAL LOW (ref 0.7–4.0)
Monocytes Absolute: 0.3 10*3/uL (ref 0.1–1.0)
Monocytes Relative: 4 %
Neutro Abs: 7.8 10*3/uL — ABNORMAL HIGH (ref 1.7–7.7)
Neutrophils Relative %: 91 %
nRBC: 0 /100 WBC

## 2018-08-21 LAB — URINE CULTURE: Culture: NO GROWTH

## 2018-08-21 MED ORDER — SODIUM CHLORIDE 0.9 % IV SOLN
1.0000 g | INTRAVENOUS | Status: DC
  Administered 2018-08-21: 1 g via INTRAVENOUS
  Filled 2018-08-21: qty 10
  Filled 2018-08-21: qty 1

## 2018-08-21 MED ORDER — DEXTROSE 5 % IV SOLN
250.0000 mg | INTRAVENOUS | Status: DC
  Administered 2018-08-21: 250 mg via INTRAVENOUS
  Filled 2018-08-21 (×2): qty 250

## 2018-08-21 MED ORDER — GABAPENTIN 300 MG PO CAPS
300.0000 mg | ORAL_CAPSULE | Freq: Two times a day (BID) | ORAL | Status: DC
  Administered 2018-08-21 – 2018-08-23 (×5): 300 mg via ORAL
  Filled 2018-08-21 (×5): qty 1

## 2018-08-21 MED ORDER — CLONAZEPAM 0.5 MG PO TABS
0.5000 mg | ORAL_TABLET | Freq: Every day | ORAL | Status: DC
  Administered 2018-08-21 – 2018-08-22 (×2): 0.5 mg via ORAL
  Filled 2018-08-21 (×2): qty 1

## 2018-08-21 MED ORDER — CLONAZEPAM 0.5 MG PO TABS
0.5000 mg | ORAL_TABLET | Freq: Once | ORAL | Status: AC
  Administered 2018-08-21: 0.5 mg via ORAL
  Filled 2018-08-21: qty 1

## 2018-08-21 MED ORDER — SALINE SPRAY 0.65 % NA SOLN
1.0000 | NASAL | Status: DC | PRN
  Administered 2018-08-21: 1 via NASAL
  Filled 2018-08-21: qty 44

## 2018-08-21 MED ORDER — TRAZODONE HCL 100 MG PO TABS
100.0000 mg | ORAL_TABLET | Freq: Every day | ORAL | Status: DC
  Administered 2018-08-21 – 2018-08-22 (×2): 100 mg via ORAL
  Filled 2018-08-21 (×2): qty 1

## 2018-08-21 NOTE — Progress Notes (Signed)
Subjective: CC: PNA Patient asking to see psychiatrist. Notes he is anxious. Denies SOB.   Objective: Vital signs in last 24 hours: Temp:  [98.1 F (36.7 C)-98.4 F (36.9 C)] 98.4 F (36.9 C) (07/18 0750) Pulse Rate:  [86-118] 107 (07/18 0750) Resp:  [14-29] 16 (07/18 0750) BP: (99-160)/(64-104) 148/89 (07/18 0750) SpO2:  [93 %-100 %] 99 % (07/18 0750) Weight:  [97.5 kg-102.9 kg] 102.9 kg (07/17 2011) Last BM Date: 08/19/18  Intake/Output from previous day: 07/17 0701 - 07/18 0700 In: 100 [IV Piggyback:100] Out: 400 [Urine:400] Intake/Output this shift: No intake/output data recorded.  PE: Gen:  Alert, NAD, pleasant Card:  Tachycardic no M/G/R heard Pulm:  Crackles at bases b/l. Normal rate and effort.  Abd: Soft, NT/ND, +BS Ext:  No erythema, edema, or tenderness BUE/BLE. Moves all joints without difficulty.  Psych: A&Ox3  Skin: no rashes noted, warm and dry   Lab Results:  Recent Labs    08/21/18 0611 08/21/18 0817  WBC 7.6 8.6  HGB 10.5* 10.3*  HCT 33.7* 32.4*  PLT 420* 414*   BMET Recent Labs    08/20/18 1244 08/20/18 1251 08/21/18 0611  NA 135 134* 139  K 3.7 3.6 3.3*  CL 101 103 105  CO2 20*  --  23  GLUCOSE 131* 132* 105*  BUN 12 14 12   CREATININE 1.24 1.10 1.06  CALCIUM 8.6*  --  8.6*   PT/INR Recent Labs    08/20/18 1244  LABPROT 15.0  INR 1.2   CMP     Component Value Date/Time   NA 139 08/21/2018 0611   K 3.3 (L) 08/21/2018 0611   CL 105 08/21/2018 0611   CO2 23 08/21/2018 0611   GLUCOSE 105 (H) 08/21/2018 0611   BUN 12 08/21/2018 0611   CREATININE 1.06 08/21/2018 0611   CALCIUM 8.6 (L) 08/21/2018 0611   PROT 6.8 08/21/2018 0611   ALBUMIN 3.0 (L) 08/21/2018 0611   AST 58 (H) 08/21/2018 0611   ALT 76 (H) 08/21/2018 0611   ALKPHOS 51 08/21/2018 0611   BILITOT 2.2 (H) 08/21/2018 0611   GFRNONAA >60 08/21/2018 0611   GFRAA >60 08/21/2018 0611   Lipase  No results found for: LIPASE     Studies/Results: Ct  Head Wo Contrast  Result Date: 08/20/2018 CLINICAL DATA:  Cervical spine injury, ligament injury suspected. Suicide attempt, fall from second story balcony EXAM: CT HEAD WITHOUT CONTRAST CT CERVICAL SPINE WITHOUT CONTRAST TECHNIQUE: Multidetector CT imaging of the head and cervical spine was performed following the standard protocol without intravenous contrast. Multiplanar CT image reconstructions of the cervical spine were also generated. COMPARISON:  None. FINDINGS: CT HEAD FINDINGS Brain: No evidence of acute infarction, hemorrhage, hydrocephalus, extra-axial collection or mass lesion/mass effect. Vascular: No hyperdense vessel or unexpected calcification. Skull: Prominent appearance of the lambdoid sutures is symmetric bilaterally without adjacent stranding or other features suggest diastasis. No definite calvarial fracture or suspicious osseous lesion. No scalp swelling or hematoma. Sinuses/Orbits: Nodular thickening right maxillary sinus paranasal sinuses and mastoid air cells are otherwise predominantly clear. And Orbital structures are unremarkable. Other: None None CT CERVICAL SPINE FINDINGS Alignment: Straightening of the normal cervical lordosis with slight reversal centered at C5. No traumatic listhesis. Skull base and vertebrae: No acute fracture. No primary bone lesion or focal pathologic process. Soft tissues and spinal canal: No pre or paravertebral fluid or swelling. No visible canal hematoma. Disc levels: Disc spaces are well preserved. No significant spinal canal  or foraminal stenosis. Upper chest: Scattered foci of upper mediastinal gas. No visible tracheal or esophageal injury. Other: None. IMPRESSION: No acute intracranial abnormality. No calvarial fracture or scalp hematoma. No acute cervical spine fracture or traumatic listhesis. Cervical stabilization collar in place. Scattered foci of upper mediastinal gas, for further details see dedicated chest CT. Electronically Signed   By: Kreg ShropshirePrice   DeHay M.D.   On: 08/20/2018 15:13   Ct Chest W Contrast  Result Date: 08/20/2018 CLINICAL DATA:  Blunt abdominal trauma, suicide attempt, fall from second story balcony. Denies injuries, pain. EXAM: CT CHEST AND ABDOMEN WITH CONTRAST TECHNIQUE: Multidetector CT imaging of the chest and abdomen was performed following the standard protocol during bolus administration of intravenous contrast. CONTRAST:  100mL OMNIPAQUE IOHEXOL 300 MG/ML  SOLN COMPARISON:  None. FINDINGS: CT CHEST FINDINGS Cardiovascular: Evaluation of the aortic root is limited due to cardiac pulsation artifact. The aorta is normal caliber. No intramural hematoma, dissection flap or other luminal abnormality of the aorta is seen. No periaortic stranding or hemorrhage. Normal heart size. No pericardial effusion. Mediastinum/Nodes: Scattered foci of upper and middle mediastinal gas with air also seen tracking into the right hilum along the right mainstem bronchus. No abnormal esophageal thickening to suggest a site of esophageal injury. Thyroid is unremarkable. No enlarged mediastinal or axillary lymph nodes. Lungs/Pleura: Multifocal areas of airspace consolidation with a basilar and largely peripheral predominance. There is gas tracking along the right major fissure as well as a trace left pneumothorax seen medially as well. No pleural fluid. Musculoskeletal: No acute osseous abnormality or suspicious osseous lesion. Mild gynecomastia. No chest wall contusion CT ABDOMEN FINDINGS Hepatobiliary: No focal liver abnormality is seen. No hepatic injury. No perihepatic hematoma. No gallstones, gallbladder wall thickening, or biliary dilatation. Pancreas: Unremarkable. No pancreatic ductal dilatation or surrounding inflammatory changes. Spleen: Normal in size without focal abnormality. No direct splenic injury or perisplenic hematoma. Adrenals/Urinary Tract: No adrenal hemorrhage or renal injury identified. Bladder is unremarkable. Stomach/Bowel: Distal  esophagus, stomach and duodenal sweep are unremarkable. No bowel wall thickening or dilatation. No evidence of obstruction. Scattered colonic diverticula without focal pericolonic inflammation to suggest diverticulitis. A normal appendix is visualized. No mesenteric hematoma or active contrast extravasation. Vascular/Lymphatic: The aorta is normal caliber. No aortic or other major vascular injury identified. Other: No abdominopelvic free fluid or free gas. No bowel containing hernias. No body wall contusion or hematoma. No traumatic abdominal wall hernia. Musculoskeletal: No acute osseous abnormality or suspicious osseous lesion. 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 IMPRESSION: 1. Multifocal areas of airspace consolidation with a basilar and largely peripheral predominance. Appearance and distribution is not entirely typical for pulmonary contusion and should consider assessment for infectious symptoms/underlying pneumonia. 2. Nonspecific pneumomediastinum and trace left pneumothorax. Similarly while these could be posttraumatic and arise in the setting of blunt thoracic trauma, may also arise spontaneously in the setting of persistent cough/infection. No CT evidence of direct esophageal or tracheal injury. 3. No other evidence of acute traumatic injury within the chest, abdomen, or pelvis. These results were called by telephone at the time of interpretation on 08/20/2018 at 3:33 pm to Dr. Particia NearingHaviland, who verbally acknowledged these results. Electronically Signed   By: Kreg ShropshirePrice  DeHay M.D.   On: 08/20/2018 15:34   Ct Cervical Spine Wo Contrast  Result Date: 08/20/2018 CLINICAL DATA:  Cervical spine injury, ligament injury suspected. Suicide attempt, fall from second story balcony EXAM: CT HEAD WITHOUT CONTRAST CT CERVICAL SPINE WITHOUT CONTRAST TECHNIQUE:  Multidetector CT imaging of the head and cervical spine was performed following the standard protocol without intravenous contrast.  Multiplanar CT image reconstructions of the cervical spine were also generated. COMPARISON:  None. FINDINGS: CT HEAD FINDINGS Brain: No evidence of acute infarction, hemorrhage, hydrocephalus, extra-axial collection or mass lesion/mass effect. Vascular: No hyperdense vessel or unexpected calcification. Skull: Prominent appearance of the lambdoid sutures is symmetric bilaterally without adjacent stranding or other features suggest diastasis. No definite calvarial fracture or suspicious osseous lesion. No scalp swelling or hematoma. Sinuses/Orbits: Nodular thickening right maxillary sinus paranasal sinuses and mastoid air cells are otherwise predominantly clear. And Orbital structures are unremarkable. Other: None None CT CERVICAL SPINE FINDINGS Alignment: Straightening of the normal cervical lordosis with slight reversal centered at C5. No traumatic listhesis. Skull base and vertebrae: No acute fracture. No primary bone lesion or focal pathologic process. Soft tissues and spinal canal: No pre or paravertebral fluid or swelling. No visible canal hematoma. Disc levels: Disc spaces are well preserved. No significant spinal canal or foraminal stenosis. Upper chest: Scattered foci of upper mediastinal gas. No visible tracheal or esophageal injury. Other: None. IMPRESSION: No acute intracranial abnormality. No calvarial fracture or scalp hematoma. No acute cervical spine fracture or traumatic listhesis. Cervical stabilization collar in place. Scattered foci of upper mediastinal gas, for further details see dedicated chest CT. Electronically Signed   By: Kreg ShropshirePrice  DeHay M.D.   On: 08/20/2018 15:13   Ct Abdomen Pelvis W Contrast  Result Date: 08/20/2018 CLINICAL DATA:  Blunt abdominal trauma, suicide attempt, fall from second story balcony. Denies injuries, pain. EXAM: CT CHEST AND ABDOMEN WITH CONTRAST TECHNIQUE: Multidetector CT imaging of the chest and abdomen was performed following the standard protocol during bolus  administration of intravenous contrast. CONTRAST:  100mL OMNIPAQUE IOHEXOL 300 MG/ML  SOLN COMPARISON:  None. FINDINGS: CT CHEST FINDINGS Cardiovascular: Evaluation of the aortic root is limited due to cardiac pulsation artifact. The aorta is normal caliber. No intramural hematoma, dissection flap or other luminal abnormality of the aorta is seen. No periaortic stranding or hemorrhage. Normal heart size. No pericardial effusion. Mediastinum/Nodes: Scattered foci of upper and middle mediastinal gas with air also seen tracking into the right hilum along the right mainstem bronchus. No abnormal esophageal thickening to suggest a site of esophageal injury. Thyroid is unremarkable. No enlarged mediastinal or axillary lymph nodes. Lungs/Pleura: Multifocal areas of airspace consolidation with a basilar and largely peripheral predominance. There is gas tracking along the right major fissure as well as a trace left pneumothorax seen medially as well. No pleural fluid. Musculoskeletal: No acute osseous abnormality or suspicious osseous lesion. Mild gynecomastia. No chest wall contusion CT ABDOMEN FINDINGS Hepatobiliary: No focal liver abnormality is seen. No hepatic injury. No perihepatic hematoma. No gallstones, gallbladder wall thickening, or biliary dilatation. Pancreas: Unremarkable. No pancreatic ductal dilatation or surrounding inflammatory changes. Spleen: Normal in size without focal abnormality. No direct splenic injury or perisplenic hematoma. Adrenals/Urinary Tract: No adrenal hemorrhage or renal injury identified. Bladder is unremarkable. Stomach/Bowel: Distal esophagus, stomach and duodenal sweep are unremarkable. No bowel wall thickening or dilatation. No evidence of obstruction. Scattered colonic diverticula without focal pericolonic inflammation to suggest diverticulitis. A normal appendix is visualized. No mesenteric hematoma or active contrast extravasation. Vascular/Lymphatic: The aorta is normal caliber.  No aortic or other major vascular injury identified. Other: No abdominopelvic free fluid or free gas. No bowel containing hernias. No body wall contusion or hematoma. No traumatic abdominal wall hernia. Musculoskeletal: No acute osseous abnormality or  suspicious osseous lesion. 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 IMPRESSION: 1. Multifocal areas of airspace consolidation with a basilar and largely peripheral predominance. Appearance and distribution is not entirely typical for pulmonary contusion and should consider assessment for infectious symptoms/underlying pneumonia. 2. Nonspecific pneumomediastinum and trace left pneumothorax. Similarly while these could be posttraumatic and arise in the setting of blunt thoracic trauma, may also arise spontaneously in the setting of persistent cough/infection. No CT evidence of direct esophageal or tracheal injury. 3. No other evidence of acute traumatic injury within the chest, abdomen, or pelvis. These results were called by telephone at the time of interpretation on 08/20/2018 at 3:33 pm to Dr. Gilford Raid, who verbally acknowledged these results. Electronically Signed   By: Lovena Le M.D.   On: 08/20/2018 15:34   Dg Pelvis Portable  Result Date: 08/20/2018 CLINICAL DATA:  Fall. EXAM: PORTABLE PELVIS 1-2 VIEWS COMPARISON:  None. FINDINGS: There is no evidence of pelvic fracture or diastasis. No pelvic bone lesions are seen. IMPRESSION: Negative. Electronically Signed   By: Marin Olp M.D.   On: 08/20/2018 13:33   Dg Chest Port 1 View  Result Date: 08/21/2018 CLINICAL DATA:  Follow-up pneumothorax. EXAM: PORTABLE CHEST 1 VIEW COMPARISON:  08/20/2018 FINDINGS: Persistent patchy bilateral pulmonary infiltrates left worse than right consistent with pneumonia. Some radiographic worsening on the left. No visible pneumothorax or pneumomediastinum by chest radiography. Findings at CT were quite minimal. No pleural fluid. No acute bone finding.  IMPRESSION: Persistent bilateral pulmonary infiltrates left worse than right consistent with pneumonia. Slight worsening on the left. No visible pneumothorax or pneumomediastinum by radiography. Electronically Signed   By: Nelson Chimes M.D.   On: 08/21/2018 09:09   Dg Chest Port 1 View  Result Date: 08/20/2018 CLINICAL DATA:  Fall.  Tachycardia and chest pain. EXAM: PORTABLE CHEST 1 VIEW COMPARISON:  None. FINDINGS: The heart size and mediastinal contours are within normal limits. Both lungs are clear. The visualized skeletal structures are unremarkable. Question of slight asymmetric density on the left compared to the right probably technical in nature. IMPRESSION: No active disease. Electronically Signed   By: Nelson Chimes M.D.   On: 08/20/2018 13:33    Anti-infectives: Anti-infectives (From admission, onward)   Start     Dose/Rate Route Frequency Ordered Stop   08/21/18 1400  azithromycin (ZITHROMAX) 250 mg in dextrose 5 % 125 mL IVPB     250 mg 125 mL/hr over 60 Minutes Intravenous Every 24 hours 08/21/18 1116     08/21/18 1400  cefTRIAXone (ROCEPHIN) 1 g in sodium chloride 0.9 % 100 mL IVPB     1 g 200 mL/hr over 30 Minutes Intravenous Every 24 hours 08/21/18 1116     08/20/18 1615  cefTRIAXone (ROCEPHIN) 1 g in sodium chloride 0.9 % 100 mL IVPB     1 g 200 mL/hr over 30 Minutes Intravenous  Once 08/20/18 1606 08/20/18 1905   08/20/18 1615  azithromycin (ZITHROMAX) 500 mg in sodium chloride 0.9 % 250 mL IVPB     500 mg 250 mL/hr over 60 Minutes Intravenous  Once 08/20/18 1606 08/20/18 2001       Assessment/Plan Jump from 2nd story  Pneumomediastinum  Multifocal consolidations Nonspecific pneumomediastinum Occult Left ptx  FEN - Regular VTE - Lovenox ID - Rocephin, Azithromycin  Consolidations likely 2/2 pneumonia; could be ctx although less likely given lack of any other notable chest trauma such as rib or vertebral fractures. AM CXR without evidence of  PTX or  pneumomediastinum. Will follow peripherally. Recommend IS and psychiatric eval given hx of SI and self harm.     LOS: 0 days    Jacinto Halim , [facility] 08/21/2018, 11:41 AM Pager: [phone]

## 2018-08-21 NOTE — Progress Notes (Signed)
Patient got about 4 hours of relief from his 0.5mg  dose of Klonopin. He is now becoming more anxious again, saying he is going "too fast" and he needs to be "more balanced." He has been up and in the shower and is now sitting in the recliner. He said that showering made him feel tired, but he has not slept today and it was reported he did not sleep last night. He will receive Klonopin and Trazodone at bedtime, but is asking for something more to calm his nerves now. Messaged Dr. Court Joy.

## 2018-08-21 NOTE — Progress Notes (Signed)
Called to patient's bedside for report of agitation.  Patient has no medical history and was confused on treatment.  He wonders why he was not getting IV hydration and I explained that he is able to drink and this will keep him hydrated.  This morning on rounds we discussed findings, but he could not remember going over some things.  He says because he was anxious at the time.  I reviewed medical findings from his stay and reassured him that we are treating his pneumonia.  Tamsen Snider, MD PGY1  [phone]

## 2018-08-21 NOTE — Progress Notes (Addendum)
Subjective: Sitter present when entering room.  Patient says he is still anxious.  We informed him he was on antibiotics for his pneumonia.  He is looking forward to talking to psychiatry today.  Patient says he does not like to wear the oxygen because he is a person with strong believer in God and would like to sit back and relax himself.  Denies SI, HI.  Received collateral from girlfriend this morning.  Her story is similar to the story in initial H&P.  Reports he did not say anything about hurting himself however she is very concerned that he has become anxious and manic in the last 2 days.  This all started with his infection 2 weeks ago and worsened in the past few days when he was practicing social distancing at home.  He has been very upset after his friend passed away from a boating accident recently. Attempted to call his mother, reached voicemail.  Objective:  Vital signs in last 24 hours: Vitals:   08/20/18 2011 08/20/18 2014 08/20/18 2015 08/21/18 0750  BP:  131/84  (!) 148/89  Pulse:  86  (!) 107  Resp:  18  16  Temp:  98.2 F (36.8 C)  98.4 F (36.9 C)  TempSrc:  Oral  Oral  SpO2:  100% 100% 99%  Weight: 102.9 kg     Height: 6\' 4"  (1.93 m)      Physical Exam Constitutional:      Comments: Appears anxious   Cardiovascular:     Rate and Rhythm: Regular rhythm. Tachycardia present.  Pulmonary:     Effort: No respiratory distress.     Breath sounds: Rales present. No wheezing or rhonchi.  Skin:    General: Skin is warm and dry.  Neurological:     Mental Status: He is alert.  Psychiatric:     Comments: Denies SI HI. Anxious. Normal mood. Conversation logical.     7/17 BCx x 2: NGTD  Assessment/Plan:  Active Problems:   Pneumonia  Is a 33 year old male no past medical history who presents after a reported suicide attempt by jumping from a two-story building.  No fractures or active bleeding seen on the following imaging; CT head,CT cervical spine, CT abdomen  and pelvis.  Questionable suicide attempt, patient reports jumping to come to the hospital and family would not allow him to go out the door.  First provider to be acutely psychotic and patient referred received Geodon injection.  On interview patient with a few statements that seemed grandiose, but did not appear acutely psychotic now.  Logical progression through story. He endorses thoughts of SI without plan.  Denies HI.  Mother may have weapons in the home.  Her number is [phone].  Girlfriends [phone].Drug test 4 salicylates acetaminophen negative.  Ethanol level undetectable.  Lactic acid 1.7.  Slightly elevated AST and ALT.  Will monitor patient overnight and have psychiatry see him.  Patient is seeking help for what he says have been a rough 2 weeks and have him feeling down.   #SI/Psych  UDS positive for benzos, benzos given by EMS. HIV negative.  UA negative for nitrite and leukocyte esterase.  Patient denies any SI today.  His conversation is concerning. Th patient says his mother brought him some things last night.  He said it is important have some of his things around him, but then says that he wants his mom to get his things.  Patient visibly anxious - Psych consult - Telemetry -One-to-one  sitter  Pneumonia   Multifocal consolidation with basilar and peripheral predominance.  History of 1 week of fever and nonproductive cough.  Patient was tested for COVID-19 and flu at a clinic reported negative.  COVID-19 negative. Leukocytosis to 12.7 down trended to 8.6 this morning neutrophil predominance.  Afebrile. Patient on day 2 of ceftriaxone and azithromycin.  Blood cultures negative to date -Follow blood cultures x2 -Ceftriaxone and azithromycin day 2.  Left pneumothorax Chest x-ray this morning without evidence of pneumothorax or pneumomediastinum.  Seen by trauma surgery and they have signed off.   DVT: Lovenox Diet: Regular CODE STATUS: Full   Dispo: Anticipated  discharge in approximately 2-3 day(s).   Tamsen Snider, MD PGY1  [phone]

## 2018-08-21 NOTE — Progress Notes (Addendum)
Patient woke up sleep walking to another empty room to urinate, disoriented and not following commands of staff. HR jumped to 160s while he was in the BR. RN reidrected patient to his room and bed. Patient verbalized his's cold. warm blanket provided to patient. HR now back to upper 90s and he's sleeping with his eyes closed, respirations even and unlabored. Paged  Seawell  Jaimie A. Do.to notify. Dr. Sharon Seller called back without any new order. Will continue to monitor.

## 2018-08-21 NOTE — Progress Notes (Signed)
Late note: patient has been escalating throughout the day. He began with concerns about dehydration, questioning why he was not receiving IV fluids. He stated they made him feel "regulated" and "balanced" and now he is hot and then cold, uncomfortable and his mouth and nose are dry. Patient was provided water to drink and is drinking without issues. He was also encouraged to perform oral hygiene. I set up his O2 with humidification bottle, which he stated did not relieve the dryness. Normal saline nasal spray was then ordered. I sat with him and talked for quite some time in an attempt to assess what was triggering him. I provided the information I had available to me at the time, including updating him on the status of his HIV testing. I thought this would be encouraging to him given his pneumonia diagnosis. I told him to focus on positives in the moment and use distraction against negative thoughts. His anxiety continued to get worse and he complained of shortness of breath and chest tightness. It was at this point that I asked for medication (see orders). I delivered the medication and a dose of Rocephin (which was ordered after our conversation) and the patient became more agitated and kept saying, "this is crazy." I asked him to clarify and he said he was upset because he was just now getting the information he wanted about his antibiotics. He said he came to the hospital to get all the treatment he needed and he feels that we have not been treating him or keeping him informed. I have been in contact with Dr. Daryll Drown multiple times and we have addressed each issue as it presents itself. Dr. Court Joy also spoke with the patient at length at the bedside. Patient denies suicidal ideations. Will keep 1:1 sitter and continue to monitor.

## 2018-08-21 NOTE — Consult Note (Signed)
Telepsych Consultation   Reason for Consult: ''depression, psychosis, anxiety, SI, Self harming-jumped off 2nd floor balcony.'' Referring Physician:  Dr. Daryll Drown Location of Patient: [facility] Location of Provider: [facility]  Patient Identification: Richard [name] MRN:  [id number] Principal Diagnosis: Adjustment disorder with mixed anxiety and depressed mood Diagnosis:  Principal Problem:   Adjustment disorder with mixed anxiety and depressed mood Active Problems:   Pneumonia   Total Time spent with patient: 45 minutes  Subjective:   Richard [name] is a 33 y.o. male patient admitted after he jumped off 2nd floor balcony.  HPI: Thanks for asking me to do a psychiatric consultation on Mr.  [name] , a 33 year old man who denies previous history of mental illness. Patient reports that he was in perfect health condition until about 2 weeks ago when he developed significant anxiety, excessive worries, apprehensions, irritability, depression, sleep problem, poor appetite, hopelessness and vague suicidal thoughts. He states that all symptoms has become progressively worsened. Few days ago he had  asked his family to take him to the hospital for help but instead for fear of contracting Covid-19 at the hospital, he was persuaded to be managed at home. Patient reports that he jumped off 2nd floor balcony at home in order to escape and come to the hospital for help with anxiety/depression. Patient reports that he has been extremely stressed out and overwhelmed since 2 of his close friends died in the last 2 months back to back. One of the drowned 2 weeks ago and the other 2nd friend committed suicide by overdose less than 2 months ago. Today, he denies psychosis, delusions,  suicidal ideations but unable to contract for safety. He is endorsing significant anxiety, nervousness, insomnia, worries, apprehension, depression and requesting for mental help. Patient denies drugs and alcohol abuse.  Past  Psychiatric History: none reported Risk to Self:  denies today, but patient unable to contact for safety Risk to Others:  denies Prior Inpatient Therapy:  N/A Prior Outpatient Therapy:  N/A  Past Medical History: History reviewed. No pertinent past medical history. History reviewed. No pertinent surgical history. Family History: History reviewed. No pertinent family history. Family Psychiatric  History:  Social History:  Social History   Substance and Sexual Activity  Alcohol Use  . Frequency: Never     Social History   Substance and Sexual Activity  Drug Use Not on file    Social History   Socioeconomic History  . Marital status: Single    Spouse name: Not on file  . Number of children: Not on file  . Years of education: Not on file  . Highest education level: Not on file  Occupational History  . Not on file  Social Needs  . Financial resource strain: Not on file  . Food insecurity    Worry: Not on file    Inability: Not on file  . Transportation needs    Medical: Not on file    Non-medical: Not on file  Tobacco Use  . Smoking status: Never Smoker  . Smokeless tobacco: Never Used  Substance and Sexual Activity  . Alcohol Use    Frequency: Never  . Drug use: Not on file  . Sexual activity: Not on file  Lifestyle  . Physical activity    Days per week: Not on file    Minutes per session: Not on file  . Stress: Not on file  Relationships  . Social connections    Talks on phone: Not on  file    Gets together: Not on file    Attends religious service: Not on file    Active member of club or organization: Not on file    Attends meetings of clubs or organizations: Not on file    Relationship status: Not on file  Other Topics Concern  . Not on file  Social History Narrative  . Not on file   Additional Social History:    Allergies:  No Known Allergies  Labs:  Results for orders placed or performed during the hospital encounter of 08/20/18 (from the past 48  hour(s))  Comprehensive metabolic panel     Status: Abnormal   Collection Time: 08/20/18 12:44 PM  Result Value Ref Range   Sodium 135 135 - 145 mmol/L   Potassium 3.7 3.5 - 5.1 mmol/L   Chloride 101 98 - 111 mmol/L   CO2 20 (L) 22 - 32 mmol/L   Glucose, Bld 131 (H) 70 - 99 mg/dL   BUN 12 6 - 20 mg/dL   Creatinine, Ser 1.61 0.61 - 1.24 mg/dL   Calcium 8.6 (L) 8.9 - 10.3 mg/dL   Total Protein 7.1 6.5 - 8.1 g/dL   Albumin 3.3 (L) 3.5 - 5.0 g/dL   AST 59 (H) 15 - 41 U/L   ALT 75 (H) 0 - 44 U/L   Alkaline Phosphatase 52 38 - 126 U/L   Total Bilirubin 2.9 (H) 0.3 - 1.2 mg/dL   GFR calc non Af Amer >60 >60 mL/min   GFR calc Af Amer >60 >60 mL/min   Anion gap 14 5 - 15    Comment: Performed at [facility], [address], [place], [place] [address]  CBC     Status: Abnormal   Collection Time: 08/20/18 12:44 PM  Result Value Ref Range   WBC 12.7 (H) 4.0 - 10.5 K/uL   RBC 4.51 4.22 - 5.81 MIL/uL   Hemoglobin 10.6 (L) 13.0 - 17.0 g/dL   HCT 54.0 (L) 98.1 - 19.1 %   MCV 72.9 (L) 80.0 - 100.0 fL   MCH 23.5 (L) 26.0 - 34.0 pg   MCHC 32.2 30.0 - 36.0 g/dL   RDW 47.8 29.5 - 62.1 %   Platelets 365 150 - 400 K/uL   nRBC 0.0 0.0 - 0.2 %    Comment: Performed at [facility], [address], [place], [place] [address]  Ethanol     Status: None   Collection Time: 08/20/18 12:44 PM  Result Value Ref Range   Alcohol, Ethyl (B) <10 <10 mg/dL    Comment: (NOTE) Lowest detectable limit for serum alcohol is 10 mg/dL. For medical purposes only. Performed at [facility], [address], [place], [place] [address]   Lactic acid, plasma     Status: None   Collection Time: 08/20/18 12:44 PM  Result Value Ref Range   Lactic Acid, Venous 1.7 0.5 - 1.9 mmol/L    Comment: Performed at [facility], [address] [place], [place] [address]  Protime-INR     Status: None   Collection Time: 08/20/18 12:44 PM  Result Value Ref Range   Prothrombin Time 15.0 11.4 - 15.2  seconds   INR 1.2 0.8 - 1.2    Comment: (NOTE) INR goal varies based on device and disease states. Performed at [facility], [address], [place], [place] [address]   Sample to Blood Bank     Status: None  Collection Time: 08/20/18 12:45 PM  Result Value Ref Range   Blood Bank Specimen SAMPLE AVAILABLE FOR TESTING    Sample Expiration      08/21/2018,2359 Performed at [facility], [address] [place], [place] [address]   I-stat chem 8, ED     Status: Abnormal   Collection Time: 08/20/18 12:51 PM  Result Value Ref Range   Sodium 134 (L) 135 - 145 mmol/L   Potassium 3.6 3.5 - 5.1 mmol/L   Chloride 103 98 - 111 mmol/L   BUN 14 6 - 20 mg/dL    Comment: QA FLAGS AND/OR RANGES MODIFIED BY DEMOGRAPHIC UPDATE ON 07/17 AT 1324   Creatinine, Ser 1.10 0.61 - 1.24 mg/dL   Glucose, Bld 604132 (H) 70 - 99 mg/dL   Calcium, Ion 5.400.96 (L) 1.15 - 1.40 mmol/L   TCO2 22 22 - 32 mmol/L   Hemoglobin 11.9 (L) 13.0 - 17.0 g/dL   HCT 98.135.0 (L) 19.139.0 - 47.852.0 %  Acetaminophen level     Status: Abnormal   Collection Time: 08/20/18  3:06 PM  Result Value Ref Range   Acetaminophen (Tylenol), Serum <10 (L) 10 - 30 ug/mL    Comment: (NOTE) Therapeutic concentrations vary significantly. A range of 10-30 ug/mL  may be an effective concentration for many patients. However, some  are best treated at concentrations outside of this range. Acetaminophen concentrations >150 ug/mL at 4 hours after ingestion  and >50 ug/mL at 12 hours after ingestion are often associated with  toxic reactions. Performed at [facility], [address] [place], [place] [address]   Salicylate level     Status: None   Collection Time: 08/20/18  3:06 PM  Result Value Ref Range   Salicylate Lvl <7.0 2.8 - 30.0 mg/dL    Comment: Performed at [facility], [address] [place], [place] [address]  SARS Coronavirus 2 (CEPHEID - Performed in [facility] hospital lab), Hosp Order     Status: None    Collection Time: 08/20/18  4:16 PM   Specimen: Nasopharyngeal Swab  Result Value Ref Range   SARS Coronavirus 2 NEGATIVE NEGATIVE    Comment: (NOTE) If result is NEGATIVE SARS-CoV-2 target nucleic acids are NOT DETECTED. The SARS-CoV-2 RNA is generally detectable in upper and lower  respiratory specimens during the acute phase of infection. The lowest  concentration of SARS-CoV-2 viral copies this assay can detect is 250  copies / mL. A negative result does not preclude SARS-CoV-2 infection  and should not be used as the sole basis for treatment or other  patient management decisions.  A negative result may occur with  improper specimen collection / handling, submission of specimen other  than nasopharyngeal swab, presence of viral mutation(s) within the  areas targeted by this assay, and inadequate number of viral copies  (<250 copies / mL). A negative result must be combined with clinical  observations, patient history, and epidemiological information. If result is POSITIVE SARS-CoV-2 target nucleic acids are DETECTED. The SARS-CoV-2 RNA is generally detectable in upper and lower  respiratory specimens dur ing the acute phase of infection.  Positive  results are indicative of active infection with SARS-CoV-2.  Clinical  correlation with patient history and other diagnostic information is  necessary to determine patient infection status.  Positive results do  not rule out bacterial infection or co-infection with other viruses. If result is PRESUMPTIVE POSTIVE SARS-CoV-2 nucleic acids MAY BE PRESENT.   A presumptive  positive result was obtained on the submitted specimen  and confirmed on repeat testing.  While 2019 novel coronavirus  (SARS-CoV-2) nucleic acids may be present in the submitted sample  additional confirmatory testing may be necessary for epidemiological  and / or clinical management purposes  to differentiate between  SARS-CoV-2 and other Sarbecovirus currently known  to infect humans.  If clinically indicated additional testing with an alternate test  methodology [phone]([id number]) is advised. The SARS-CoV-2 RNA is generally  detectable in upper and lower respiratory sp ecimens during the acute  phase of infection. The expected result is Negative. Fact Sheet for Patients:  BoilerBrush.com.cyhttps://www.fda.gov/media/136312/download Fact Sheet for Healthcare Providers: https://pope.com/https://www.fda.gov/media/136313/download This test is not yet approved or cleared by the [place] FDA and has been authorized for detection and/or diagnosis of SARS-CoV-2 by FDA under an Emergency Use Authorization (EUA).  This EUA will remain in effect (meaning this test can be used) for the duration of the COVID-19 declaration under Section 564(b)(1) of the Act, 21 U.S.C. section 360bbb-3(b)(1), unless the authorization is terminated or revoked sooner. Performed at [facility], [address] [place], [place] [address]   Culture, blood (routine x 2)     Status: None (Preliminary result)   Collection Time: 08/20/18  5:20 PM   Specimen: BLOOD  Result Value Ref Range   Specimen Description BLOOD RIGHT ANTECUBITAL    Special Requests      BOTTLES DRAWN AEROBIC AND ANAEROBIC Blood Culture adequate volume   Culture      NO GROWTH < 24 HOURS Performed at [facility], [address] [place], [place] [address]    Report Status PENDING   Culture, blood (routine x 2)     Status: None (Preliminary result)   Collection Time: 08/20/18  5:25 PM   Specimen: BLOOD  Result Value Ref Range   Specimen Description BLOOD LEFT ANTECUBITAL    Special Requests      BOTTLES DRAWN AEROBIC AND ANAEROBIC Blood Culture adequate volume   Culture      NO GROWTH < 24 HOURS Performed at [facility], [address] [place], [place] [address]    Report Status PENDING   Rapid urine drug screen (hospital performed)     Status: Abnormal   Collection Time: 08/20/18  7:05 PM  Result Value Ref Range    Opiates NONE DETECTED NONE DETECTED   Cocaine NONE DETECTED NONE DETECTED   Benzodiazepines POSITIVE (A) NONE DETECTED   Amphetamines NONE DETECTED NONE DETECTED   Tetrahydrocannabinol NONE DETECTED NONE DETECTED   Barbiturates NONE DETECTED NONE DETECTED    Comment: (NOTE) DRUG SCREEN FOR MEDICAL PURPOSES ONLY.  IF CONFIRMATION IS NEEDED FOR ANY PURPOSE, NOTIFY LAB WITHIN 5 DAYS. LOWEST DETECTABLE LIMITS FOR URINE DRUG SCREEN Drug Class                     Cutoff (ng/mL) Amphetamine and metabolites    1000 Barbiturate and metabolites    200 Benzodiazepine                 200 Tricyclics and metabolites     300 Opiates and metabolites        300 Cocaine and metabolites        300 THC                            50 Performed at [facility], [address] [place], [place] [address]   Urinalysis, Routine w reflex microscopic     Status: Abnormal   Collection Time: 08/20/18  7:06 PM  Result Value Ref Range   Color, Urine AMBER (A) YELLOW    Comment: BIOCHEMICALS MAY BE AFFECTED BY COLOR   APPearance CLEAR CLEAR   Specific Gravity, Urine >1.046 (H) 1.005 - 1.030   pH 6.0 5.0 - 8.0   Glucose, UA NEGATIVE NEGATIVE mg/dL   Hgb urine dipstick SMALL (A) NEGATIVE   Bilirubin Urine NEGATIVE NEGATIVE   Ketones, ur 5 (A) NEGATIVE mg/dL   Protein, ur 30 (A) NEGATIVE mg/dL   Nitrite NEGATIVE NEGATIVE   Leukocytes,Ua NEGATIVE NEGATIVE   RBC / HPF 0-5 0 - 5 RBC/hpf   WBC, UA 0-5 0 - 5 WBC/hpf   Bacteria, UA NONE SEEN NONE SEEN    Comment: Performed at [facility], [address], [place], [place] [address]  Rapid HIV screen (HIV 1/2 Ab+Ag)     Status: None   Collection Time: 08/20/18  8:14 PM  Result Value Ref Range   HIV-1 P24 Antigen - HIV24 NON REACTIVE NON REACTIVE   HIV 1/2 Antibodies NON REACTIVE NON REACTIVE   Interpretation (HIV Ag Ab)      A non reactive test result means that HIV 1 or HIV 2 antibodies and HIV 1 p24 antigen were not detected in the specimen.     Comment: Performed at [facility], [address], [place], [place] [address]  Comprehensive metabolic panel     Status: Abnormal   Collection Time: 08/21/18  6:11 AM  Result Value Ref Range   Sodium 139 135 - 145 mmol/L   Potassium 3.3 (L) 3.5 - 5.1 mmol/L   Chloride 105 98 - 111 mmol/L   CO2 23 22 - 32 mmol/L   Glucose, Bld 105 (H) 70 - 99 mg/dL   BUN 12 6 - 20 mg/dL   Creatinine, Ser 9.14 0.61 - 1.24 mg/dL   Calcium 8.6 (L) 8.9 - 10.3 mg/dL   Total Protein 6.8 6.5 - 8.1 g/dL   Albumin 3.0 (L) 3.5 - 5.0 g/dL   AST 58 (H) 15 - 41 U/L   ALT 76 (H) 0 - 44 U/L   Alkaline Phosphatase 51 38 - 126 U/L   Total Bilirubin 2.2 (H) 0.3 - 1.2 mg/dL   GFR calc non Af Amer >60 >60 mL/min   GFR calc Af Amer >60 >60 mL/min   Anion gap 11 5 - 15    Comment: Performed at [facility], [address], [place], [place] [address]  CBC     Status: Abnormal   Collection Time: 08/21/18  6:11 AM  Result Value Ref Range   WBC 7.6 4.0 - 10.5 K/uL   RBC 4.53 4.22 - 5.81 MIL/uL   Hemoglobin 10.5 (L) 13.0 - 17.0 g/dL   HCT 62.1 (L) 30.8 - 65.7 %   MCV 74.4 (L) 80.0 - 100.0 fL   MCH 23.2 (L) 26.0 - 34.0 pg   MCHC 31.2 30.0 - 36.0 g/dL   RDW 84.6 96.2 - 95.2 %   Platelets 420 (H) 150 - 400 K/uL   nRBC 0.0 0.0 - 0.2 %    Comment: Performed at [facility], [address], [place], [place] [address]  Differential     Status: Abnormal   Collection Time: 08/21/18  8:17 AM  Result Value Ref Range   Neutrophils Relative %  91 %   Neutro Abs 7.8 (H) 1.7 - 7.7 K/uL   Lymphocytes Relative 4 %   Lymphs Abs 0.3 (L) 0.7 - 4.0 K/uL   Monocytes Relative 4 %   Monocytes Absolute 0.3 0.1 - 1.0 K/uL   Eosinophils Relative 0 %   Eosinophils Absolute 0.0 0.0 - 0.5 K/uL   Basophils Relative 1 %   Basophils Absolute 0.1 0.0 - 0.1 K/uL   nRBC 0 0 /100 WBC   Abs Immature Granulocytes 0.00 0.00 - 0.07 K/uL    Comment: Performed at [facility], [address] [place], [place] [address]  CBC      Status: Abnormal   Collection Time: 08/21/18  8:17 AM  Result Value Ref Range   WBC 8.6 4.0 - 10.5 K/uL   RBC 4.43 4.22 - 5.81 MIL/uL   Hemoglobin 10.3 (L) 13.0 - 17.0 g/dL   HCT 13.0 (L) 86.5 - 78.4 %   MCV 73.1 (L) 80.0 - 100.0 fL   MCH 23.3 (L) 26.0 - 34.0 pg   MCHC 31.8 30.0 - 36.0 g/dL   RDW 69.6 29.5 - 28.4 %   Platelets 414 (H) 150 - 400 K/uL   nRBC 0.0 0.0 - 0.2 %    Comment: Performed at [facility], [address] [place], [place] [address]    Medications:  Current Facility-Administered Medications  Medication Dose Route Frequency Provider Last Rate Last Dose  . acetaminophen (TYLENOL) tablet 650 mg  650 mg Oral Q6H PRN Beola Cord, MD       Or  . acetaminophen (TYLENOL) suppository 650 mg  650 mg Rectal Q6H PRN Beola Cord, MD      . azithromycin (ZITHROMAX) 250 mg in dextrose 5 % 125 mL IVPB  250 mg Intravenous Q24H Beola Cord, MD      . cefTRIAXone (ROCEPHIN) 1 g in sodium chloride 0.9 % 100 mL IVPB  1 g Intravenous Q24H Beola Cord, MD 200 mL/hr at 08/21/18 1249 1 g at 08/21/18 1249  . enoxaparin (LOVENOX) injection 40 mg  40 mg Subcutaneous Q24H Beola Cord, MD      . gabapentin (NEURONTIN) capsule 300 mg  300 mg Oral BID Patricia Fargo, MD      . sodium chloride (OCEAN) 0.65 % nasal spray 1 spray  1 spray Each Nare PRN Inez Catalina, MD   1 spray at 08/21/18 1246  . sodium chloride flush (NS) 0.9 % injection 3 mL  3 mL Intravenous Q12H Beola Cord, MD   3 mL at 08/21/18 1250  . traZODone (DESYREL) tablet 100 mg  100 mg Oral QHS Thedore Mins, MD        Musculoskeletal: Strength & Muscle Tone: within normal limits Gait & Station: normal Patient leans: N/A  Psychiatric Specialty Exam: Physical Exam  Psychiatric: His speech is normal. Thought content normal. His mood appears anxious. He is hyperactive. Cognition and memory are normal. He expresses impulsivity. He exhibits a depressed mood.    Review of Systems   Constitutional: Negative.   HENT: Negative.   Eyes: Negative.   Respiratory: Positive for shortness of breath.   Cardiovascular: Negative.   Gastrointestinal: Negative.   Genitourinary: Negative.   Musculoskeletal: Negative.   Skin: Negative.   Neurological: Negative.   Endo/Heme/Allergies: Negative.   Psychiatric/Behavioral: Positive for depression. The patient is nervous/anxious and has insomnia.     Blood pressure (!) 148/89, pulse (!) 107, temperature 98.4 F (36.9 C), temperature source Oral, resp.  rate 16, height 6\' 4"  (1.93 m), weight 102.9 kg, SpO2 99 %.Body mass index is 27.61 kg/m.  General Appearance: Casual  Eye Contact:  Good  Speech:  Clear and Coherent  Volume:  Increased  Mood:  Depressed  Affect:  Constricted and ANXIOUS  Thought Process:  Coherent and Linear  Orientation:  Full (Time, Place, and Person)  Thought Content:  Logical  Suicidal Thoughts:  No  Homicidal Thoughts:  No  Memory:  Immediate;   Good Recent;   Good Remote;   Good  Judgement:  Poor  Insight:  Shallow  Psychomotor Activity:  Increased and Restlessness  Concentration:  Concentration: Fair and Attention Span: Fair  Recall:  Good  Fund of Knowledge:  Good  Language:  Good  Akathisia:  No  Handed:  Right  AIMS (if indicated):     Assets:  Communication Skills Desire for Improvement  ADL's:  Intact  Cognition:  WNL  Sleep:   poor     Treatment Plan Summary: 33 year old male with no prior history of mental illness who was admitted to the hospital after he jumped off 2nd floor balcony so that he can be brought to the hospital for mental treatment. Patient reports worsening, anxiety, depression, vague suicidal thoughts since 2 of his friends died in the last 2 months. He reports being stressed out and overwhelmed with grief. Patient will benefit from inpatient admission to psych after he is medically stable.  Recommendations: -Consider 1:1 sitter for safety -Consider Gabapentin 300  mg BID for anxiety/agitation -Consider Trazodone 100 mg daily ata bedtime for anxiety/depression/Insomnia -Consider social worker consult to facilitate inpatient psych admission when patient is medically cleared. -Psychiatric service signing out. Re-consult as needed.  Disposition: Recommend psychiatric Inpatient admission when medically cleared. Supportive therapy provided about ongoing stressors.  This service was provided via telemedicine using a 2-way, interactive audio and video technology.  Names of all persons participating in this telemedicine service and their role in this encounter. Name: Stark Kleinarl Smaltz Role: patient  Name: Thedore MinsMojeed Kupono Marling, MD Role: psychiatrist    Thedore MinsMojeed Margarito Dehaas, MD 08/21/2018 1:15 PM

## 2018-08-21 NOTE — Progress Notes (Signed)
Messaged by nursing to come to the floor and assess patient.  Patient continues to be anxious and perseverate. Patient reports the medication made him feel copacetic, but the shower made him feel anxious.  He is perseverating over IV normal saline and says it makes him feel better.  He can feel it going in and makes his body better.  He appears to be more anxious than my last visit.  He denies hearing any audible voices or seeing things that others do not see.  He says that he is very spiritual and there is something inside of him telling him he is going to be okay.  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  Patient ordered clonazepam 0.5 mg now.  Clonazepam 0.5 mg at bedtime.    Tamsen Snider, MD PGY1  [phone]

## 2018-08-22 DIAGNOSIS — R440 Auditory hallucinations: Secondary | ICD-10-CM

## 2018-08-22 LAB — STREP PNEUMONIAE URINARY ANTIGEN: Strep Pneumo Urinary Antigen: NEGATIVE

## 2018-08-22 MED ORDER — CEFDINIR 300 MG PO CAPS: 300.0000 mg | ORAL_CAPSULE | Freq: Two times a day (BID) | ORAL | Status: DC

## 2018-08-22 MED ORDER — CLONAZEPAM 0.5 MG PO TABS
0.5000 mg | ORAL_TABLET | Freq: Once | ORAL | Status: AC
  Administered 2018-08-22: 0.5 mg via ORAL
  Filled 2018-08-22: qty 1

## 2018-08-22 MED ORDER — AZITHROMYCIN 250 MG PO TABS
250.0000 mg | ORAL_TABLET | Freq: Every day | ORAL | Status: DC
  Administered 2018-08-22 – 2018-08-23 (×2): 250 mg via ORAL
  Filled 2018-08-22 (×2): qty 1

## 2018-08-22 MED ORDER — CEFDINIR 300 MG PO CAPS
300.0000 mg | ORAL_CAPSULE | Freq: Two times a day (BID) | ORAL | Status: DC
  Administered 2018-08-22 – 2018-08-23 (×3): 300 mg via ORAL
  Filled 2018-08-22 (×4): qty 1

## 2018-08-22 MED ORDER — RAMELTEON 8 MG PO TABS
8.0000 mg | ORAL_TABLET | Freq: Once | ORAL | Status: AC
  Administered 2018-08-22: 8 mg via ORAL
  Filled 2018-08-22: qty 1

## 2018-08-22 NOTE — Progress Notes (Addendum)
Subjective: Mr Chicoine was seen on rounds this AM. He continues to have a sitter present.  He is feeling okay today. We discussed his treatment in detail as he has anxiety and concerns about what he is being treat for and states he just wants all the details. He states at time he want to hurt himself, but doe not want to let himself do this. He denied Active plan when we spoke. He state he slept okay. He denies A/V Hallucinations. And stated his mood was tired.  I went back by his room later I the morning and we discussed possibility of transfer to behavioral health hospital not that his is medically cleared and off all IV medications (He was informed of the transition to cefdinir and azithromycin).  He is agreeable to proceed with behavioral hospital placement, we discussed first two possibilities of facility in [place] or near [place]..  Objective:  Vital signs in last 24 hours: Vitals:   08/21/18 2053 08/21/18 2057 08/21/18 2151 08/22/18 0121  BP: (!) 182/116 (!) 161/101 140/89 129/77  Pulse: (!) 104   (!) 103  Resp: 19   19  Temp: 98.5 F (36.9 C)   98.8 F (37.1 C)  TempSrc: Oral   Oral  SpO2: 99%   97%  Weight:      Height:       Physical Exam Constitutional:      Comments: Appears anxious   Cardiovascular:     Rate and Rhythm: Regular rhythm. Tachycardia present.  Pulmonary:     Effort: Pulmonary effort is normal. No respiratory distress.     Comments: RLL Rales Other fields clear Skin:    General: Skin is warm and dry.  Neurological:     General: No focal deficit present.     Mental Status: He is alert. Mental status is at baseline.  Psychiatric:     Comments: Anxious, perseverates on treatments    Assessment/Plan:  Principal Problem:   Adjustment disorder with mixed anxiety and depressed mood Active Problems:   Pneumonia  33 year old male no significant past medical history presents after a reported suicide attempt by jumping from a two-story  building.  No fractures or active bleeding seen on CT head, CT cervical spine, CT abdomen and pelvis.  Questionable suicide attempt, patient reports jumping to come to the hospital and family would not allow him to go out the door.  First provider found pt to be acutely psychotic and pt received Geodon injection. Patient has endorsed history of anxiety and has made some grandiose statement and has shown some paranoia. He has endorsed thoughts of SI without plan. Mother may have weapons in the home. Her number is [phone].  Girlfriends [phone]. UDS and Tox screen on admit on only positive for Benzos given in route. Patient is seeking help for what he says have been a rough 2 weeks and have him feeling down.   #SI/Psych: UDS and Tox screen on admit on only positive for Benzos given in route. HIV negative. No active plan to hurt himself today, but has had thoughts of hurting himself in the past; unable to to get clear answer about current SI. His thought patern is inapropriate and he appears to exhibit paranoia at times.  Patient visibly anxious. Patient now medically cleared for inpatient psuch admission. - Appreciate Psych recs - Gabapentin - Trazdone - PRN Clonazepam - One-to-one sitter  Pneumonia Multifocal consolidation with basilar and peripheral predominance.  History of 1 week of fever and  nonproductive cough.  Patient was tested for COVID-19 and flu at a clinic reported negative. Repeat COVID-19 negativehere. Leukocytosis to 12.7 down trended to 8.6.  Afebrile. S/P 2 days of IV Ceftriaxone and Azithromycin..  Blood cultures negative to date - Follow blood cultures x2 - Cefdinir PO BID and Azithromycin PO Daily  Left pneumothorax: Stable. Small PTX noted on CT. No significant PTX noted on repeat CXR in AM. Seen by trauma surgery and they have signed off.  DVT: Lovenox Diet: Regular CODE STATUS: Full  Dispo: Anticipated discharge 0-1 day(s). To inpatient psych, will discuss with  family.  Ginette OttoAlec , DO IM PGY-3

## 2018-08-22 NOTE — Progress Notes (Signed)
Patient still anxious and on his cell phone talking to his mom at this time. Patient stated to the safety sitter he was having suicidal thoughts and hallucinations.  Endorsed to this RN he's hearing voices which is telling him to stop breathing. He stated, "I don't want to die but the voice is telling me to." Paged Dr. Sharon Seller to notify her of patient's suicidal ideation. Dr. Sharon Seller at bedside talking to patient right now. New order for Ramelteon 8 mg received.  Will administer and continue to monitor.

## 2018-08-22 NOTE — Progress Notes (Signed)
Paged for Mr. Richard [name] having extreme anxiety and difficulty sleeping and auditory hallucinations. At bedside patient was pacing around the room. He states he is feeling very anxious and needs to know what medications he is taking. He has a desire to hold his breath and stop his breathing but does not want to die. He is, however, having suicidal thoughts but states he would never actually kill himself as he is too scared to die. He states he just wants to know which medications he is receiving and why as he is scared he may stop breathing in his sleep. He also wants to go back to being calm like he was earlier today. He has circumstantial and tangential speech and is unable to completely articulate all of his thoughts but is alert and oriented, interactive, and not confused. When asked if he is hearing voices or hallucinating he discussed that he is a man of prayer and discussed this instead. He is pending possible inpatient psych admission once he is medically stable.   - discussed what each of his medications were for - will give ramelteon, he has taken this before and said it has helped him sleep - received klonipin .5mg  x2 yesterday, gabapentin 300 mg, and trazodone 100 mg. He was agreeable to trying ramelteon and will consider additional dose of klonipin if he continues to have anxiety.  - continue 1 to 1 sitter

## 2018-08-22 NOTE — Progress Notes (Signed)
Patient angry that he had to wait on me to come to his room to talk to him. He was watching me go in and out of another patient's room across the hall and he could be heard saying I had "no compassion." I had updated his sitter that I called his doctor for more anxiety medication and that I was waiting on pharmacy to process the order. This information was given to the patient. When he saw me come out of the room across from him, he said to his sitter, "see? I told you." The sitter reminded him the order was processing and I went to get it. When I returned to his room, he wanted to confront me. He said that he heard me laugh about him and tell a doctor "if you have time to call me, you have time to come see the patient." There was no such interaction for him to witness. I attempted to deescalate him, to which he said "you're lying" and began to test me about the reasons for him being hospitalized. He said, "I have high blood pressure. You didn't mention that." At that point, I assured him that our relationship was that he came to the hospital seeking care and I was here to provide that care. He then asked me if our conversation was real. I provided him reassurance and he calmed down and took his Klonopin.

## 2018-08-22 NOTE — Progress Notes (Addendum)
CSW received a phone call from the resident about the patient needing inpatient psychiatric treatment. According to the MD and reviewing his notes, the patient is agreeable to going to inpatient treatment.   CSW called Mendel Ryder at [facility] and made the referral. Mendel Ryder stated that they do not have any beds available today but would take the referral and review the patient's information. Mendel Ryder stated she would call the CSW back with a bed offer.   CSW also met with the patient at bedside. CSW asked if the patient was agreeable to going to treatment voluntarily. He was. CSW explained the process. CSW answered the patient's questions and offered emotional support.   CSW will continue to follow.   Domenic Schwab, MSW, [place] Worker [facility]  [phone]

## 2018-08-22 NOTE — Progress Notes (Signed)
Patient woke up again and verbalized his anxiety. Reassurance provided to patient by this RN. Patient  told the safety sitter at bedside that he would use his blanket to suffocate himself. V/S are stable. HR normally goes up with agitation and exertion. No acute distress noted. Will continue to monitor.

## 2018-08-22 NOTE — Progress Notes (Signed)
Patient is up and super anxious and requesting for anxiety mediation. Paged and talked to Dr. Sharon Seller and she indicated she'll look at his chart.

## 2018-08-23 ENCOUNTER — Other Ambulatory Visit: Payer: Self-pay

## 2018-08-23 ENCOUNTER — Encounter (HOSPITAL_COMMUNITY): Payer: Self-pay | Admitting: *Deleted

## 2018-08-23 ENCOUNTER — Other Ambulatory Visit: Payer: Self-pay | Admitting: Registered Nurse

## 2018-08-23 ENCOUNTER — Other Ambulatory Visit: Payer: Self-pay | Admitting: Family

## 2018-08-23 ENCOUNTER — Inpatient Hospital Stay (HOSPITAL_COMMUNITY)
Admission: AD | Admit: 2018-08-23 | Discharge: 2018-09-01 | DRG: 885 | Disposition: A | Discharge status: Home / Self Care | Payer: Federal, State, Local not specified - Other | Source: Intra-hospital | Attending: Psychiatry | Admitting: Psychiatry

## 2018-08-23 DIAGNOSIS — G4700 Insomnia, unspecified: Secondary | ICD-10-CM | POA: Diagnosis present

## 2018-08-23 DIAGNOSIS — F329 Major depressive disorder, single episode, unspecified: Secondary | ICD-10-CM | POA: Diagnosis present

## 2018-08-23 DIAGNOSIS — R45851 Suicidal ideations: Secondary | ICD-10-CM | POA: Diagnosis present

## 2018-08-23 DIAGNOSIS — F339 Major depressive disorder, recurrent, unspecified: Secondary | ICD-10-CM | POA: Diagnosis not present

## 2018-08-23 DIAGNOSIS — F22 Delusional disorders: Secondary | ICD-10-CM

## 2018-08-23 DIAGNOSIS — F323 Major depressive disorder, single episode, severe with psychotic features: Principal | ICD-10-CM | POA: Diagnosis present

## 2018-08-23 DIAGNOSIS — J189 Pneumonia, unspecified organism: Secondary | ICD-10-CM | POA: Diagnosis present

## 2018-08-23 DIAGNOSIS — F4323 Adjustment disorder with mixed anxiety and depressed mood: Secondary | ICD-10-CM | POA: Diagnosis present

## 2018-08-23 DIAGNOSIS — J982 Interstitial emphysema: Secondary | ICD-10-CM | POA: Diagnosis present

## 2018-08-23 DIAGNOSIS — R45850 Homicidal ideations: Secondary | ICD-10-CM | POA: Diagnosis not present

## 2018-08-23 DIAGNOSIS — F4322 Adjustment disorder with anxiety: Secondary | ICD-10-CM

## 2018-08-23 DIAGNOSIS — F200 Paranoid schizophrenia: Secondary | ICD-10-CM | POA: Diagnosis not present

## 2018-08-23 DIAGNOSIS — J939 Pneumothorax, unspecified: Secondary | ICD-10-CM | POA: Diagnosis present

## 2018-08-23 DIAGNOSIS — J159 Unspecified bacterial pneumonia: Principal | ICD-10-CM

## 2018-08-23 DIAGNOSIS — R451 Restlessness and agitation: Secondary | ICD-10-CM | POA: Diagnosis present

## 2018-08-23 DIAGNOSIS — Z781 Physical restraint status: Secondary | ICD-10-CM | POA: Diagnosis not present

## 2018-08-23 DIAGNOSIS — Z79899 Other long term (current) drug therapy: Secondary | ICD-10-CM | POA: Diagnosis not present

## 2018-08-23 DIAGNOSIS — T1491XA Suicide attempt, initial encounter: Secondary | ICD-10-CM | POA: Diagnosis not present

## 2018-08-23 DIAGNOSIS — F322 Major depressive disorder, single episode, severe without psychotic features: Secondary | ICD-10-CM | POA: Diagnosis present

## 2018-08-23 DIAGNOSIS — X80XXXA Intentional self-harm by jumping from a high place, initial encounter: Secondary | ICD-10-CM | POA: Diagnosis not present

## 2018-08-23 LAB — HEPATITIS B SURFACE ANTIBODY, QUANTITATIVE: Hep B S AB Quant (Post): >1000 m[IU]/mL (ref >9.9)

## 2018-08-23 LAB — HEPATITIS B SURFACE ANTIGEN: Hepatitis B Surface Ag: NEGATIVE

## 2018-08-23 LAB — HEPATITIS C ANTIBODY: HCV Ab: 0.3 s/co ratio (ref 0.0–0.9)

## 2018-08-23 LAB — HEPATITIS B CORE ANTIBODY, TOTAL: Hep B Core Total Ab: NEGATIVE

## 2018-08-23 MED ORDER — AZITHROMYCIN 250 MG PO TABS: 250.0000 mg | ORAL_TABLET | Freq: Every day | ORAL | Status: DC

## 2018-08-23 MED ORDER — DIPHENHYDRAMINE HCL 50 MG/ML IJ SOLN
50.0000 mg | Freq: Once | INTRAMUSCULAR | Status: AC
  Administered 2018-08-23: 23:00:00 50 mg via INTRAMUSCULAR
  Filled 2018-08-23 (×2): qty 1

## 2018-08-23 MED ORDER — CLONAZEPAM 0.5 MG PO TABS: 0.5000 mg | ORAL_TABLET | Freq: Two times a day (BID) | ORAL | 0 refills | Status: DC | PRN

## 2018-08-23 MED ORDER — CLONAZEPAM 0.5 MG PO TABS: 0.5000 mg | ORAL_TABLET | Freq: Every day | ORAL | 0 refills | Status: DC

## 2018-08-23 MED ORDER — CLONAZEPAM 0.5 MG PO TABS
0.5000 mg | ORAL_TABLET | Freq: Once | ORAL | Status: AC
  Administered 2018-08-23: 0.5 mg via ORAL
  Filled 2018-08-23: qty 1

## 2018-08-23 MED ORDER — ACETAMINOPHEN 325 MG PO TABS: 650.0000 mg | ORAL_TABLET | Freq: Four times a day (QID) | ORAL | Status: DC | PRN

## 2018-08-23 MED ORDER — CEFDINIR 300 MG PO CAPS: 300.0000 mg | ORAL_CAPSULE | Freq: Two times a day (BID) | ORAL | Status: DC

## 2018-08-23 MED ORDER — AZITHROMYCIN 250 MG PO TABS: 250.0000 mg | ORAL_TABLET | Freq: Every day | ORAL | 0 refills | Status: DC

## 2018-08-23 MED ORDER — ZIPRASIDONE MESYLATE 20 MG IM SOLR
INTRAMUSCULAR | Status: AC
  Filled 2018-08-23: qty 20

## 2018-08-23 MED ORDER — ZIPRASIDONE MESYLATE 20 MG IM SOLR
20.0000 mg | Freq: Once | INTRAMUSCULAR | Status: AC
  Administered 2018-08-23: 23:00:00 20 mg via INTRAMUSCULAR
  Filled 2018-08-23: qty 20

## 2018-08-23 MED ORDER — GABAPENTIN 300 MG PO CAPS: 300.0000 mg | ORAL_CAPSULE | Freq: Two times a day (BID) | ORAL | Status: DC

## 2018-08-23 MED ORDER — ALUM & MAG HYDROXIDE-SIMETH 200-200-20 MG/5ML PO SUSP: 30.0000 mL | ORAL | Status: DC | PRN

## 2018-08-23 MED ORDER — TRAZODONE HCL 100 MG PO TABS: 100.0000 mg | ORAL_TABLET | Freq: Every day | ORAL | Status: DC

## 2018-08-23 MED ORDER — TRAZODONE HCL 100 MG PO TABS: 100.0000 mg | ORAL_TABLET | Freq: Every evening | ORAL | Status: DC | PRN

## 2018-08-23 MED ORDER — LORAZEPAM 2 MG/ML IJ SOLN
2.0000 mg | Freq: Once | INTRAMUSCULAR | Status: AC
  Administered 2018-08-23: 2 mg via INTRAMUSCULAR
  Filled 2018-08-23: qty 1

## 2018-08-23 MED ORDER — HYDROXYZINE HCL 25 MG PO TABS
25.0000 mg | ORAL_TABLET | Freq: Three times a day (TID) | ORAL | Status: DC | PRN
  Administered 2018-08-23 – 2018-08-26 (×5): 25 mg via ORAL
  Filled 2018-08-23 (×6): qty 1

## 2018-08-23 MED ORDER — TRAZODONE HCL 50 MG PO TABS
50.0000 mg | ORAL_TABLET | Freq: Every evening | ORAL | Status: DC | PRN
  Administered 2018-08-23 – 2018-08-31 (×6): 50 mg via ORAL
  Filled 2018-08-23: qty 4
  Filled 2018-08-23 (×3): qty 1

## 2018-08-23 MED ORDER — CLONAZEPAM 0.5 MG PO TABS: 0.5000 mg | ORAL_TABLET | Freq: Two times a day (BID) | ORAL | Status: DC | PRN

## 2018-08-23 MED ORDER — MAGNESIUM HYDROXIDE 400 MG/5ML PO SUSP: 30.0000 mL | Freq: Every day | ORAL | Status: DC | PRN

## 2018-08-23 MED ORDER — HYDROXYZINE HCL 25 MG PO TABS: 25.0000 mg | ORAL_TABLET | Freq: Three times a day (TID) | ORAL | Status: DC | PRN

## 2018-08-23 NOTE — TOC Transition Note (Addendum)
Transition of Care [facility]) - CM/SW Discharge Note   Patient Details  Name: Richard [name] MRN: [id number] Date of Birth: 04-18-1985  Transition of Care [facility]) CM/SW Contact:  Geralynn Ochs, LCSW Phone Number: 08/23/2018, 3:40 PM   Clinical Narrative:   Patient being discharged to [facility] today. Going to room 405 bed 2. Dr. Mallie Darting will be attending physician. Transportation set for 4:30 PM. RN to call report to [phone].  RN to call report before patient is picked up. Transportation will need patient to have a mask on for transportation.    Final next level of care: Psychiatric Hospital Barriers to Discharge: Barriers Resolved   Patient Goals and CMS Choice Patient states their goals for this hospitalization and ongoing recovery are:: to feel better, more calm      Discharge Placement                Patient to be transferred to facility by: [place] Name of family member notified: Self Patient and family notified of of transfer: 08/23/18  Discharge Plan and Services                                     Social Determinants of Health (SDOH) Interventions     Readmission Risk Interventions No flowsheet data found.

## 2018-08-23 NOTE — Discharge Summary (Signed)
Name: Richard [name] MRN: [id number] DOB: 1985-08-31 33 y.o. PCP: Patient, No Pcp Per  Date of Admission: 08/20/2018 12:26 PM Date of Discharge:  Attending Physician: Sid Falcon, MD  Discharge Diagnosis: 1. Bacterial Pneumonia 2. Adjustment disorder vs acute psychotic disorder  Discharge Medications: Allergies as of 08/23/2018   No Known Allergies     Medication List    TAKE these medications   acetaminophen 325 MG tablet Commonly known as: TYLENOL Take 975 mg by mouth every 6 (six) hours as needed for headache (pain).   azithromycin 250 MG tablet Commonly known as: ZITHROMAX Take 1 tablet (250 mg total) by mouth daily for 1 day.   cefdinir 300 MG capsule Commonly known as: OMNICEF Take 1 capsule (300 mg total) by mouth every 12 (twelve) hours.   clonazePAM 0.5 MG tablet Commonly known as: KLONOPIN Take 1 tablet (0.5 mg total) by mouth at bedtime.   clonazePAM 0.5 MG tablet Commonly known as: KLONOPIN Take 1 tablet (0.5 mg total) by mouth 2 (two) times daily as needed (anxiety; agitation).   diphenhydrAMINE 25 MG tablet Commonly known as: BENADRYL Take 50-75 mg by mouth at bedtime as needed for sleep.   gabapentin 300 MG capsule Commonly known as: NEURONTIN Take 1 capsule (300 mg total) by mouth 2 (two) times daily.   ibuprofen 200 MG tablet Commonly known as: ADVIL Take 800 mg by mouth every 6 (six) hours as needed for headache (pain).   traZODone 100 MG tablet Commonly known as: DESYREL Take 1 tablet (100 mg total) by mouth at bedtime.       Disposition and follow-up:   Richard [name] was discharged from [facility] in Stable condition.  At the hospital follow up visit please address:  1.    Adjustment disorder vs. Acute psychotic disorder : psychiatry consulted and recommended starting gabapentin and trazodone.  Patient also treated with clonazepam. - Evaluate/Treat   Pneumonia : Patient with 1 week of reported fever and  nonproductive cough.  Leukocytosis 12.7 with left shift on admission, trending down to 8.6.  Remained afebrile on admission.  2 days of IV ceftriaxone and azithromycin.  Started on p.o. cefdinir and azithromycin on 7/20. -Continue p.o. Cefdinir and azithromycin for 2 days ( 7/20 is day 3/5 of treatment)  2.  Labs / imaging needed at time of follow-up: N/A  3.  Pending labs/ test needing follow-up: N/A  Follow-up Appointments:   Hospital Course by problem list: 1. Pneumonia Multifocal consolidation with basilar and peripheral predominance. History of 1 week of fever and nonproductive cough. Patient was tested for COVID-19 and flu at a clinic reported negative.RepeatCOVID-19 negative here. CT Chest shows multifocal areas of airspace consolidation with a basilar and largely peripheral predominanceLeukocytosis to 12.7 down trendedto 8.6.Afebrile.S/P 2 days of IV Ceftriaxone and Azithromycin. On Cefdinir and Azithro PO starting 7/19. Blood cultures negative to date. Strep pneumo urine analysis negative. On 7/20 patient afebrile , without cough, %Sat well on room air. - Follow blood cultures x2 -Cefdinir PO BID and Azithromycin PO Daily, day 3/5   2.  Adjustment disorder versus acute psychotic disorder Questionable suicide attempt, patient reports jumping to come to the hospital because family would not allow him to go out the door.First providerfound ptto be acutely psychotic and ptreceived Geodon injection.Patient has endorsed history of anxiety and has made somegrandiosestatements and has shown some paranoia.He hasendorsedthoughts of SI without plan. Mother may have weapons in the home. Her number is [phone].  Girlfriends [phone].UDS and Tox screen on admit  only positive for Benzos given in route.Patient is seeking help for what he says have been a rough 2 weeks and have him feeling down.  On admission patient continued to express paranoia that he would die and  feels like he needs to stay awake to control breathing.  He is unable to articulate how he will be harmed but perseverates on this.  Patient has a strong belief in God and his family can collaborate, however is unsure if he actually thinks God and the devil are talking to him.  He denies hearing audible voices seeing things other people do not see.  He has been seen by psychiatry on this admission.  Will be transferred to the behavioral health hospital for further treatment.    Discharge Vitals:   BP (!) 137/97 (BP Location: Right Arm)   Pulse 98   Temp 98.9 F (37.2 C) (Oral)   Resp 20   Ht 6\' 4"  (1.93 m)   Wt 102.9 kg   SpO2 99%   BMI 27.61 kg/m   Pertinent Labs, Studies, and Procedures:  CBC Latest Ref Rng & Units 08/21/2018 08/21/2018 08/20/2018  WBC 4.0 - 10.5 K/uL 8.6 7.6 -  Hemoglobin 13.0 - 17.0 g/dL 10.3(L) 10.5(L) 11.9(L)  Hematocrit 39.0 - 52.0 % 32.4(L) 33.7(L) 35.0(L)  Platelets 150 - 400 K/uL 414(H) 420(H) -   CMP Latest Ref Rng & Units 08/21/2018 08/20/2018 08/20/2018  Glucose 70 - 99 mg/dL 295(A105(H) 213(Y132(H) 865(H131(H)  BUN 6 - 20 mg/dL 12 14 12   Creatinine 0.61 - 1.24 mg/dL 8.461.06 9.621.10 9.521.24  Sodium 135 - 145 mmol/L 139 134(L) 135  Potassium 3.5 - 5.1 mmol/L 3.3(L) 3.6 3.7  Chloride 98 - 111 mmol/L 105 103 101  CO2 22 - 32 mmol/L 23 - 20(L)  Calcium 8.9 - 10.3 mg/dL 8.4(X8.6(L) - 8.6(L)  Total Protein 6.5 - 8.1 g/dL 6.8 - 7.1  Total Bilirubin 0.3 - 1.2 mg/dL 2.2(H) - 2.9(H)  Alkaline Phos 38 - 126 U/L 51 - 52  AST 15 - 41 U/L 58(H) - 59(H)  ALT 0 - 44 U/L 76(H) - 75(H)   HIV NON REACTIVE (07/17 2014)  Hep B surface antigen and core antibody: neg Hep B surface antibody >1000  Hep C < .3 (<.9 neg results)  7/17 CT Head / Spine Impression:  No acute intracranial abnormality. No calvarial fracture or scalp hematoma.  No acute cervical spine fracture or traumatic listhesis. Cervical stabilization collar in place.  Scattered foci of upper mediastinal gas, for further  details see dedicated chest CT.  7/17 CT Chest Abd Pelvis with contrast IMPRESSION: 1. Multifocal areas of airspace consolidation with a basilar and largely peripheral predominance. Appearance and distribution is not entirely typical for pulmonary contusion and should consider assessment for infectious symptoms/underlying pneumonia. 2. Nonspecific pneumomediastinum and trace left pneumothorax. Similarly while these could be posttraumatic and arise in the setting of blunt thoracic trauma, may also arise spontaneously in the setting of persistent cough/infection. No CT evidence of direct esophageal or tracheal injury. 3. No other evidence of acute traumatic injury within the chest, abdomen, or pelvis.  7/18 Chest xray  Persistent bilateral pulmonary infiltrates left worse than right consistent with pneumonia. Slight worsening on the left. No visible pneumothorax or pneumomediastinum by radiography.   Discharge Instructions: Discharge Instructions    Discharge instructions   Complete by: As directed    Thank you for allowing us to care for you.  You  were treated for pneumonia with antibitotics, which are working well.  You are being transferred to the behavioral health hospital to continue to work on your behavioral health.  We recommend establishing with a primary care doctor/provider when you are able      Signed: Thurmon FairJeff Jerzee Jerome, MD PGY1  [phone]

## 2018-08-23 NOTE — Progress Notes (Addendum)
Patient endorses SI, AVH, and cannot contract for safety.   This RN went to the patients room to get him to walk to the med window. Patient was found lying flat on his stomach on the floor with his head facing the opposite wall. He would not say why he was laying there nor if he was hearing any voices or seeing any visions. Patient walked down the hall and talked to this RN for a while at the med window where he eventually took trazodone and vistaril per provider order. Patient educated on medications and safety on the unit. Patient is very paranoid and scared. He cannot get his thoughts out at times. He puts his head in his hands and apologizes many times. He reports feeling he is going to hurt someone if he hurts himself. He reports he is very nervous and that he is grieving. He states "I don't want you to be here to see" and cannot complete the thought. This RN asks the patient to come get staff and ask for help if his suicidal thoughts get worse or if he starts to make a plan about how he might hurt himself in his room. The patient states "I'm getting you now". This RN wanted the patient to clarify about if he thought he was going to hurt himself in his room. The patient nodded yes. He had been sitting on the bench between 400 and 300 for a good amount of time before group this evening. This RN asked him to sit back over there so the charge nurse could be consulted about how to ensure the patients safety. The patient started walking down the 300 hall and he was asked to come back toward the nurses station. He then hit a door/wall. A MHT hit the starr button on their badge. He did come back toward the nurses station and then turned in to the [place]. He hit the wall again. Staff including the provider arrived and his chart was being pulled up while this RN gave a verbal report to the provider. The patient managed to get the door to the dayroom shut and he pushed a chair against it. He was in the  dayroom by himself. He piled at least two more chairs on the one that was against the door. This RN and two others were overriding and pulling IM medications that the provider decided on. The patient was seen by security pull the curtain off the window and put it around his neck. Staff entered the dayroom through the other entrance. The patient sat down. A show force with the staff that was present was all the was needed. The patient allowed this RN to administer IM medications. The patient still endorsed that he could not contract for safety. He was escorted to the quiet room and 1:1 observation started. Patient remains safe and will continue to monitor.

## 2018-08-23 NOTE — TOC Initial Note (Signed)
Transition of Care [facility]) - Initial/Assessment Note    Patient Details  Name: Richard [name] MRN: [id number] Date of Birth: May 02, 1985  Transition of Care [facility]) CM/SW Contact:    Geralynn Ochs, LCSW Phone Number: 08/23/2018, 12:16 PM  Clinical Narrative:  CSW met with patient to discuss transfer to psychiatric hospital for treatment. CSW had patient sign consent for treatment form. Patient had questions about form, but was agreeable to sign. Per Otila Kluver, no bed available yet this morning when met with patient.  Otila Kluver called CSW back later and said a bed opened up for patient. Patient's heart rate will need to be 99 or below for admission. CSW contacted RN to ask for a new set of vitals. If heart rate is below 99, then patient can admit to [facility] today. CSW to follow.                 Expected Discharge Plan: Psychiatric Hospital Barriers to Discharge: Continued Medical Work up, Psych Bed not available   Patient Goals and CMS Choice Patient states their goals for this hospitalization and ongoing recovery are:: to feel better, more calm      Expected Discharge Plan and Services Expected Discharge Plan: [facility]                                              Prior Living Arrangements/Services     Patient language and need for interpreter reviewed:: No        Need for Family Participation in Patient Care: No (Comment)     Criminal Activity/Legal Involvement Pertinent to Current Situation/Hospitalization: No - Comment as needed  Activities of Daily Living Home Assistive Devices/Equipment: None ADL Screening (condition at time of admission) Patient's cognitive ability adequate to safely complete daily activities?: Yes Is the patient deaf or have difficulty hearing?: No Does the patient have difficulty seeing, even when wearing glasses/contacts?: No Does the patient have difficulty concentrating, remembering, or making decisions?: No Patient able to express  need for assistance with ADLs?: Yes Does the patient have difficulty dressing or bathing?: No Independently performs ADLs?: Yes (appropriate for developmental age) Does the patient have difficulty walking or climbing stairs?: No Weakness of Legs: None Weakness of Arms/Hands: None  Permission Sought/Granted                  Emotional Assessment Appearance:: Appears stated age Attitude/Demeanor/Rapport: Engaged Affect (typically observed): Pleasant Orientation: : Oriented to Self, Oriented to Place, Oriented to  Time, Oriented to Situation Alcohol / Substance Use: Not Applicable Psych Involvement: Yes (comment)  Admission diagnosis:  Pneumomediastinum ([place]) [J98.2] Suicide attempt ([place]) [T14.91XA] Pneumothorax [J93.9] Psychosis, 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 Adjustment disorder with mixed anxiety and depressed mood 08/21/2018  . Pneumonia 08/20/2018   PCP:  Patient, No Pcp Per Pharmacy:   [id number] [place] [place] [address]Phone: [phone]: [phone]    Social Determinants of Health (SDOH) Interventions    Readmission Risk Interventions No flowsheet data found.

## 2018-08-23 NOTE — Tx Team (Signed)
Initial Treatment Plan 08/23/2018 6:48 PM [name] CBS:[id number]    PATIENT STRESSORS: Loss of friend by OD   PATIENT STRENGTHS: General fund of knowledge Motivation for treatment/growth Physical Health Supportive family/friends   PATIENT IDENTIFIED PROBLEMS: Depression   anxiety                   DISCHARGE CRITERIA:  Improved stabilization in mood, thinking, and/or behavior Motivation to continue treatment in a less acute level of care Need for constant or close observation no longer present Verbal commitment to aftercare and medication compliance  PRELIMINARY DISCHARGE PLAN: Return to previous living arrangement  PATIENT/FAMILY INVOLVEMENT: This treatment plan has been presented to and reviewed with the patient, Richard [name], and/or family member.  The patient and family have been given the opportunity to ask questions and make suggestions.  Judie Petit, RN 08/23/2018, 6:48 PM
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
## 2018-08-23 NOTE — Progress Notes (Signed)
Pt admitted to [facility] inpatient unit.  Pt reported often during admission that he was very fearful.   He stated he was fearful of going to sleep and not waking up.  He stated he recently had a friend OD.  Pt stated the reason he jumped out of the second story window is because he had to figure out if he had an underlying health condition.  Pt at times slow to respond to questions being asked. Pt stated he lives with parents and they are supportive.  Pt became emotional several times during admissions process.  Needs assessed.  Pt denied. Fifteen minute checks initiated.  Pt safe on unit.

## 2018-08-23 NOTE — Progress Notes (Addendum)
Subjective: Patient sitting in chair this morning on exam.  He is dressed neatly in his own cloths.  He reports still having deep feelings that he is unable to express clearly. He is reassured we are treating his pneumonia and will soon find him a bed at the psychiatric hospital for more intense treatment.  Patient's mother was called and updated after rounds.   Objective:  Vital signs in last 24 hours: Vitals:   08/22/18 1558 08/22/18 2206 08/22/18 2341 08/23/18 0547  BP: (!) 161/111 (!) 146/98 (!) 152/82 (!) 158/86  Pulse: (!) 110 (!) 115 100 100  Resp:  17 12 20   Temp: 98.2 F (36.8 C)   97.9 F (36.6 C)  TempSrc: Oral   Oral  SpO2: 97% 96% 97% 100%  Weight:      Height:       Physical Exam Constitutional:      General: He is not in acute distress. Cardiovascular:     Rate and Rhythm: Regular rhythm. Tachycardia present.  Pulmonary:     Effort: No respiratory distress.     Breath sounds: Normal breath sounds. No rales.  Abdominal:     General: Abdomen is flat. Bowel sounds are normal.  Neurological:     Mental Status: He is alert.  Psychiatric:     Comments: Feeling of impending doom.  Perseverate on treatment.  Anxious.      Assessment/Plan:  Principal Problem:   Adjustment disorder with mixed anxiety and depressed mood Active Problems:   Pneumonia  [age] year old male no significant past medical history presents after a reported suicideattemptby jumping from a two-story building.No fractures or active bleedingseen on CT head, CTcervical spine, CT abdomen and pelvis.  Questionable suicide attempt, patient reports jumping to come to the hospital and family would not allow him to go out the door.First provider found pt to be acutely psychotic and pt received Geodon injection.Patient has endorsed history of anxiety and has made somegrandiose statement and has shown some paranoia. He hasendorsed thoughts of SI without plan. Mother may have weapons in the  home. Her number is [phone]. Girlfriends [phone]. UDS and Tox screen on admit on only positive for Benzos given in route.Patient is seeking help for what he says have been a rough 2 weeks and have him feeling down.   #Adjustment Disorder versus acute psychotic disorder: Patient expresses paranoia he will die or something will harm him.  He is unable to articulate exactly how this will happen.  He perseverates over not feeling himself.  He has a normal strong belief in God that his family can collaborate, however it is unsure if he actually thinks God and the devil or talking to him.  He denies hearing audible voices or seeing things that other people do not see.  -Appreciate Psych recs - Gabapentin - Trazdone - scheduled Clonazepam qhs for anxiety/sleep  - Clonazepam .5mg  PRN BID for agitation and anxiety  - One-to-one sitter  Pneumonia Multifocal consolidation with basilar and peripheral predominance. History of 1 week of fever and nonproductive cough. Patient was tested for COVID-19 and flu at a clinic reported negative. Repeat COVID-19 negative here.Leukocytosis to 12.7 down trended to 8.6. Afebrile. S/P 2 days of IV Ceftriaxone and Azithromycin. On Cefdinir and Azithro PO starting 7/19.  Blood cultures negative to date. strep pneumo urine analysis negative.  Patient afebrile today without cough.  %Sat well on room air. - Follow blood cultures x2 - Cefdinir PO BID and Azithromycin PO Daily, day 3/5  Left pneumothorax: Stable. Small PTX noted on CT. No significant PTX noted on repeat CXR in AM. Seen by trauma surgery and they have signed off.  DVT: Lovenox Diet: Regular CODE STATUS: Full  Dispo: Anticipated discharge 0-1 day(s). To inpatient psych, patient willing and also talked with his mother who believes this may be a good idea.

## 2018-08-23 NOTE — [facility] Group Notes (Signed)
Pt did not attend wrap up group this evening. Pt was in their room.  

## 2018-08-24 DIAGNOSIS — T1491XA Suicide attempt, initial encounter: Secondary | ICD-10-CM

## 2018-08-24 DIAGNOSIS — X80XXXA Intentional self-harm by jumping from a high place, initial encounter: Secondary | ICD-10-CM

## 2018-08-24 DIAGNOSIS — F4323 Adjustment disorder with mixed anxiety and depressed mood: Secondary | ICD-10-CM

## 2018-08-24 MED ORDER — ZIPRASIDONE MESYLATE 20 MG IM SOLR: 20.0000 mg | Freq: Once | INTRAMUSCULAR | Status: AC

## 2018-08-24 MED ORDER — HALOPERIDOL 5 MG PO TABS
ORAL_TABLET | ORAL | Status: AC
  Filled 2018-08-24: qty 1

## 2018-08-24 MED ORDER — DIPHENHYDRAMINE HCL 50 MG/ML IJ SOLN
50.0000 mg | Freq: Four times a day (QID) | INTRAMUSCULAR | Status: DC | PRN
  Administered 2018-08-25 – 2018-08-28 (×2): 50 mg via INTRAMUSCULAR
  Filled 2018-08-24 (×2): qty 1

## 2018-08-24 MED ORDER — HALOPERIDOL LACTATE 5 MG/ML IJ SOLN
5.0000 mg | Freq: Three times a day (TID) | INTRAMUSCULAR | Status: DC
  Filled 2018-08-24 (×9): qty 1

## 2018-08-24 MED ORDER — LORAZEPAM 1 MG PO TABS
2.0000 mg | ORAL_TABLET | ORAL | Status: DC | PRN
  Administered 2018-08-24 – 2018-08-31 (×13): 2 mg via ORAL
  Filled 2018-08-24 (×5): qty 2
  Filled 2018-08-24: qty 3
  Filled 2018-08-24 (×7): qty 2

## 2018-08-24 MED ORDER — IBUPROFEN 800 MG PO TABS
800.0000 mg | ORAL_TABLET | Freq: Four times a day (QID) | ORAL | Status: DC | PRN
  Administered 2018-08-26: 800 mg via ORAL
  Filled 2018-08-24: qty 1

## 2018-08-24 MED ORDER — ZIPRASIDONE MESYLATE 20 MG IM SOLR
INTRAMUSCULAR | Status: AC
  Administered 2018-08-24: 09:00:00
  Filled 2018-08-24: qty 20

## 2018-08-24 MED ORDER — HALOPERIDOL 5 MG PO TABS
10.0000 mg | ORAL_TABLET | Freq: Three times a day (TID) | ORAL | Status: DC
  Administered 2018-08-24 – 2018-08-26 (×6): 10 mg via ORAL
  Filled 2018-08-24 (×10): qty 2

## 2018-08-24 MED ORDER — CEFDINIR 300 MG PO CAPS
300.0000 mg | ORAL_CAPSULE | Freq: Two times a day (BID) | ORAL | Status: DC
  Administered 2018-08-24 – 2018-09-01 (×16): 300 mg via ORAL
  Filled 2018-08-24 (×18): qty 1

## 2018-08-24 MED ORDER — HALOPERIDOL LACTATE 5 MG/ML IJ SOLN
5.0000 mg | Freq: Two times a day (BID) | INTRAMUSCULAR | Status: DC
  Filled 2018-08-24 (×2): qty 1

## 2018-08-24 MED ORDER — LORAZEPAM 2 MG/ML IJ SOLN
INTRAMUSCULAR | Status: AC
  Filled 2018-08-24: qty 2

## 2018-08-24 MED ORDER — GABAPENTIN 300 MG PO CAPS
300.0000 mg | ORAL_CAPSULE | Freq: Two times a day (BID) | ORAL | Status: DC
  Administered 2018-08-24 – 2018-08-25 (×2): 300 mg via ORAL
  Filled 2018-08-24 (×5): qty 1

## 2018-08-24 MED ORDER — HALOPERIDOL 5 MG PO TABS
5.0000 mg | ORAL_TABLET | Freq: Two times a day (BID) | ORAL | Status: DC
  Administered 2018-08-24: 09:00:00 5 mg via ORAL
  Filled 2018-08-24 (×2): qty 1

## 2018-08-24 MED ORDER — CHLORDIAZEPOXIDE HCL 25 MG PO CAPS
25.0000 mg | ORAL_CAPSULE | Freq: Three times a day (TID) | ORAL | Status: DC | PRN
  Administered 2018-08-25: 09:00:00 25 mg via ORAL
  Filled 2018-08-24 (×2): qty 1

## 2018-08-24 MED ORDER — AZITHROMYCIN 250 MG PO TABS
250.0000 mg | ORAL_TABLET | Freq: Every day | ORAL | Status: DC
  Administered 2018-08-24 – 2018-08-29 (×6): 250 mg via ORAL
  Filled 2018-08-24 (×8): qty 1

## 2018-08-24 NOTE — Plan of Care (Signed)
  Problem: Education: Goal: Emotional status will improve Outcome: Not Progressing Goal: Mental status will improve Outcome: Not Progressing Goal: Verbalization of understanding the information provided will improve Outcome: Not Progressing   Problem: Activity: Goal: Interest or engagement in activities will improve Outcome: Not Progressing   
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
## 2018-08-24 NOTE — Progress Notes (Signed)
Pt currently asleep in bed. Respiration are even and unlabored. Pt in no sign of distress. Will continue to monitor.    [place] NOVEL CORONAVIRUS (COVID-19) DAILY CHECK-OFF SYMPTOMS - answer yes or no to each - every day NO YES  Have you had a fever in the past 24 hours?  . Fever (Temp > 37.80C / 100F) X   Have you had any of these symptoms in the past 24 hours? . New Cough .  Sore Throat  .  Shortness of Breath .  Difficulty Breathing .  Unexplained Body Aches   X   Have you had any one of these symptoms in the past 24 hours not related to allergies?   . Runny Nose .  Nasal Congestion .  Sneezing   X   If you have had runny nose, nasal congestion, sneezing in the past 24 hours, has it worsened?  X   EXPOSURES - check yes or no X   Have you traveled outside the state in the past 14 days?  X   Have you been in contact with someone with a confirmed diagnosis of COVID-19 or PUI in the past 14 days without wearing appropriate PPE?  X   Have you been living in the same home as a person with confirmed diagnosis of COVID-19 or a PUI (household contact)?    X   Have you been diagnosed with COVID-19?    X              What to do next: Answered NO to all: Answered YES to anything:   Proceed with unit schedule Follow the [facility] Inpatient Flowsheet.    

## 2018-08-24 NOTE — Progress Notes (Signed)
1:1 Progress Note D: Pt currently asleep in the bed. Patient appropriate to situation. Pt in no current distress.  A: Sitter is currently at bedside. R: Pt remains safe on a 1:1 per MD orders.  

## 2018-08-24 NOTE — Plan of Care (Signed)
  Problem: Safety: Goal: Periods of time without injury will increase Outcome: Progressing   

## 2018-08-24 NOTE — Progress Notes (Signed)
Recreation Therapy Notes  Date: 08/24/2018 Time: 10:00 am Location: 500 hall   Group Topic: Healthy vs Unhealthy Coping Skills  Goal Area(s) Addresses:  Patient will work on worksheet on Healthy vs Unhealthy Coping Skills. Patient will follow directions on first prompt.  Behavioral Response: Appropriate  Intervention: Worksheet  Activity:  Staff on 500 hall were provided with a worksheet on Healthy vs Unhealthy Coping Skills. Staff was instructed to give it to the patients and have them work on it in place of Recreation Therapy Group. Staff was also given 2 coloring sheets and 2 word searches and were given the option to give them out.  Education:  Ability to follow Directions, Change of thought processes Discharge Planning, Goal Planning.   Education Outcome: Acknowledges education/In group clarification offered  Clinical Observations/Feedback: . Due to COVID-19, guidelines group was not held. Group members were provided a learning activity worksheet to work on the topic and above-stated goals. LRT is available to answer any questions patient may have regarding the worksheet.  Tacey Dimaggio L Kylen Ismael, LRT/CTRS         Richard [name] L Richard [name] 08/24/2018 10:09 AM 

## 2018-08-24 NOTE — Progress Notes (Signed)
1:1 Note  Patient is in bed with eyes closed, resting. breathing is even and unlabored, patient is in no distress. Sitter is within arms reach. Will continue to monitor and assess.

## 2018-08-24 NOTE — Progress Notes (Signed)
[place] NOVEL CORONAVIRUS (COVID-19) DAILY CHECK-OFF SYMPTOMS - answer yes or no to each - every day NO YES  Have you had a fever in the past 24 hours?  . Fever (Temp > 37.80C / 100F) X   Have you had any of these symptoms in the past 24 hours? . New Cough .  Sore Throat  .  Shortness of Breath .  Difficulty Breathing .  Unexplained Body Aches   X   Have you had any one of these symptoms in the past 24 hours not related to allergies?   . Runny Nose .  Nasal Congestion .  Sneezing   X   If you have had runny nose, nasal congestion, sneezing in the past 24 hours, has it worsened?  X   EXPOSURES - check yes or no X   Have you traveled outside the state in the past 14 days?  X   Have you been in contact with someone with a confirmed diagnosis of COVID-19 or PUI in the past 14 days without wearing appropriate PPE?  X   Have you been living in the same home as a person with confirmed diagnosis of COVID-19 or a PUI (household contact)?    X   Have you been diagnosed with COVID-19?    X              What to do next: Answered NO to all: Answered YES to anything:   Proceed with unit schedule Follow the [facility] Inpatient Flowsheet.   

## 2018-08-24 NOTE — [facility] Suicide Risk Assessment (Signed)
[facility] Admission Suicide Risk Assessment   Nursing information obtained from:  Patient Demographic factors:  Male Current Mental Status:  NA Loss Factors:  NA Historical Factors:  Family history of mental illness or substance abuse Risk Reduction Factors:  Sense of responsibility to family, Religious beliefs about death  Total Time spent with patient: 45 minutes Principal Problem: <principal problem not specified> Diagnosis:  Active Problems:   MDD (major depressive disorder), severe ([place])  Subjective Data: Patient is seen and examined.  Patient is a 33 year old male with a probable past psychiatric history significant for psychotic disorder (presumably schizophrenia) who presented originally to the [facility] emergency department on 08/20/2018 after jumping from a two-story building with the intention of killing himself.  The patient is currently psychotic, and ended up significantly agitated.  Most of the history is collected from review of the electronic medical record.  The patient was noted to have an ammonia with multifocal consolidations in his lungs.  He had a nonspecific pneumomediastinum.  He also noted to have a left pneumothorax.  During the course of the hospitalization he had periods of time with significant psychosis and agitation.  He received Geodon at least once.  His drug screen on admission was positive for benzodiazepines, and these were apparently given by emergency medical services.  He received ceftriaxone as well as azithromycin.  He was seen on the consultation service on 08/21/2018.  They recommended Neurontin, trazodone.  He was felt to be stable for transfer on 08/23/2018.  His continued medications included azithromycin and Omnicef by mouth as well as the clonazepam and Neurontin.  Unfortunately upon arrival in the hospital last night he attempted to barricade himself in a room, and had to be moved to the 500 hall.  This morning he is significantly paranoid, agitated  and threatening.  He required intramuscular PRN medications.  He does appear to be psychotic.  We have no previous psychiatric records with regard to his past history.  Unfortunately whoever admitted him last night also did not write for his antibiotics.  Continued Clinical Symptoms:  Alcohol Use Disorder Identification Test Final Score (AUDIT): 1 The "Alcohol Use Disorders Identification Test", Guidelines for Use in Primary Care, Second Edition.  World Pharmacologist [facility]). Score between 0-7:  no or low risk or alcohol related problems. Score between 8-15:  moderate risk of alcohol related problems. Score between 16-19:  high risk of alcohol related problems. Score 20 or above:  warrants further diagnostic evaluation for alcohol dependence and treatment.   CLINICAL FACTORS:   Severe Anxiety and/or Agitation Currently Psychotic   Musculoskeletal: Strength & Muscle Tone: within normal limits Gait & Station: normal Patient leans: N/A  Psychiatric Specialty Exam: Physical Exam  Nursing note and vitals reviewed. Constitutional: He appears well-developed and well-nourished.  HENT:  Head: Normocephalic and atraumatic.  Respiratory: Effort normal.  Neurological: He is alert.    ROS  Blood pressure (!) 159/108, pulse (!) 149, temperature 98.8 F (37.1 C), temperature source Oral, resp. rate 18, height 6\' 4"  (1.93 m), weight 93 kg, SpO2 99 %.Body mass index is 24.95 kg/m.  General Appearance: Disheveled  Eye Contact:  Good  Speech:  Pressured  Volume:  Increased  Mood:  Angry and Dysphoric  Affect:  Labile  Thought Process:  Disorganized and Descriptions of Associations: Loose  Orientation:  Negative  Thought Content:  Delusions and Hallucinations: Auditory  Suicidal Thoughts:  Yes.  with intent/plan  Homicidal Thoughts:  No  Memory:  Immediate;  Poor Recent;   Poor Remote;   Poor  Judgement:  Impaired  Insight:  Lacking  Psychomotor Activity:  Increased   Concentration:  Concentration: Poor and Attention Span: Poor  Recall:  Poor  Fund of Knowledge:  Fair  Language:  Good  Akathisia:  Negative  Handed:  Right  AIMS (if indicated):     Assets:  Desire for Improvement Resilience  ADL's:  Impaired  Cognition:  WNL  Sleep:  Number of Hours: 4.75      COGNITIVE FEATURES THAT CONTRIBUTE TO RISK:  None    SUICIDE RISK:   Moderate:  Frequent suicidal ideation with limited intensity, and duration, some specificity in terms of plans, no associated intent, good self-control, limited dysphoria/symptomatology, some risk factors present, and identifiable protective factors, including available and accessible social support.  PLAN OF CARE: Patient is seen and examined.  Patient is a 33 year old male with a suspected past psychiatric history significant for schizophrenia who was transferred from medical Hospital on 08/23/2018.  The patient is currently psychotic, paranoid and appearing to respond to internal stimuli.  He received Geodon this a.m. as well as lorazepam and Benadryl.  He has quieted to a degree, but we will continue haloperidol 10 mg p.o. 3 times daily IM or p.o.  He will also have available continued PRN's for lorazepam every 4 hours if agitation occurs.  Unfortunately his antibiotic medications were not continued, and I will restart those today.  We will attempt to contact his family for collateral information.  I certify that inpatient services furnished can reasonably be expected to improve the patient's condition.   Antonieta PertGreg Lawson Nakira Litzau, MD 08/24/2018, 2:31 PM

## 2018-08-24 NOTE — Progress Notes (Addendum)
1:1 Note  Patient is resting in his room with his eyes closed. Patient's sitter is within arms reach. Respirations are even and unlabored. Will continue to monitor.
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
## 2018-08-24 NOTE — Progress Notes (Signed)
1:1 note:  Patient observed sleeping on mattress in quiet room with MHT in arms reach. Respirations even and unlabored. No distress noted. Patient remains safe. 1:1 continued for safety.   
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
## 2018-08-24 NOTE — Progress Notes (Signed)
1:1 note:  Patient observed sleeping on mattress in quiet room with MHT in arms reach. Respirations even and unlabored. No distress noted. Patient remains safe. 1:1 continued for safety.

## 2018-08-24 NOTE — Progress Notes (Signed)
Patient did not attend group.

## 2018-08-24 NOTE — H&P (Signed)
Psychiatric Admission Assessment Adult  Patient Identification: Richard [name] MRN:  [id number] Date of Evaluation:  08/24/2018 Chief Complaint:  adjustment disorder with mixed anxiety and depressed mood Principal Diagnosis: <principal problem not specified> Diagnosis:  Active Problems:   MDD (major depressive disorder), severe (HCC)  History of Present Illness: Patient is seen and examined.  Patient is a 33 year old male with a probable past psychiatric history significant for psychotic disorder (presumably schizophrenia) who presented originally to the [facility] emergency department on 08/20/2018 after jumping from a two-story building with the intention of killing himself.  The patient is currently psychotic, and ended up significantly agitated.  Most of the history is collected from review of the electronic medical record.  The patient was noted to have an ammonia with multifocal consolidations in his lungs.  He had a nonspecific pneumomediastinum.  He also noted to have a left pneumothorax.  During the course of the hospitalization he had periods of time with significant psychosis and agitation.  He received Geodon at least once.  His drug screen on admission was positive for benzodiazepines, and these were apparently given by emergency medical services.  He received ceftriaxone as well as azithromycin.  He was seen on the consultation service on 08/21/2018.  They recommended Neurontin, trazodone.  He was felt to be stable for transfer on 08/23/2018.  His continued medications included azithromycin and Omnicef by mouth as well as the clonazepam and Neurontin.  Unfortunately upon arrival in the hospital last night he attempted to barricade himself in a room, and had to be moved to the 500 hall.  This morning he is significantly paranoid, agitated and threatening.  He required intramuscular PRN medications.  He does appear to be psychotic.  We have no previous psychiatric records with regard to his  past history.  Unfortunately whoever admitted him last night also did not write for his antibiotics.  Associated Signs/Symptoms: Depression Symptoms:  depressed mood, anhedonia, insomnia, psychomotor agitation, hopelessness, suicidal thoughts with specific plan, suicidal attempt, anxiety, loss of energy/fatigue, disturbed sleep, (Hypo) Manic Symptoms:  Hallucinations, Impulsivity, Irritable Mood, Labiality of Mood, Anxiety Symptoms:  Excessive Worry, Psychotic Symptoms:  Delusions, Hallucinations: Auditory Paranoia, PTSD Symptoms: Negative Total Time spent with patient: 1 hour  Past Psychiatric History: He denied initially previous psychiatric admissions in the past, but then would states something about previously he had "tried to tell them" in the hospital.  Is the patient at risk to self? Yes.    Has the patient been a risk to self in the past 6 months? No.  Has the patient been a risk to self within the distant past? No.  Is the patient a risk to others? Yes.    Has the patient been a risk to others in the past 6 months? No.  Has the patient been a risk to others within the distant past? No.   Prior Inpatient Therapy:   Prior Outpatient Therapy:    Alcohol Screening: 1. How often do you have a drink containing alcohol?: Monthly or less 2. How many drinks containing alcohol do you have on a typical day when you are drinking?: 1 or 2 3. How often do you have six or more drinks on one occasion?: Never AUDIT-C Score: 1 4. How often during the last year have you found that you were not able to stop drinking once you had started?: Never 5. How often during the last year have you failed to do what was normally expected from you becasue  of drinking?: Never 6. How often during the last year have you needed a first drink in the morning to get yourself going after a heavy drinking session?: Never 7. How often during the last year have you had a feeling of guilt of remorse after  drinking?: Never 8. How often during the last year have you been unable to remember what happened the night before because you had been drinking?: Never 9. Have you or someone [name] been injured as a result of your drinking?: No 10. Has a relative or friend or a doctor or another health worker been concerned about your drinking or suggested you cut down?: No Alcohol Use Disorder Identification Test Final Score (AUDIT): 1 Substance Abuse History in the last 12 months:  Yes.   Consequences of Substance Abuse: Negative Previous Psychotropic Medications: No  Psychological Evaluations: No  Past Medical History: History reviewed. No pertinent past medical history. History reviewed. No pertinent surgical history. Family History: History reviewed. No pertinent family history. Family Psychiatric  History: Noncontributory Tobacco Screening:   Social History:  Social History   Substance and Sexual Activity  Alcohol Use  . Frequency: Never     Social History   Substance and Sexual Activity  Drug Use Not on file    Additional Social History:                           Allergies:  No Known Allergies Lab Results:  Results for orders placed or performed during the hospital encounter of 08/20/18 (from the past 48 hour(s))  Strep pneumoniae urinary antigen     Status: None   Collection Time: 08/22/18  5:05 PM  Result Value Ref Range   Strep Pneumo Urinary Antigen NEGATIVE NEGATIVE    Comment:        Infection due to S. pneumoniae cannot be absolutely ruled out since the antigen present may be below the detection limit of the test. Performed at [facility], [address], [place],  [address]     Blood Alcohol level:  Lab Results  Component Value Date   ETH <10 60/45/4098    Metabolic Disorder Labs:  No results found for: HGBA1C, MPG No results found for: PROLACTIN No results found for: CHOL, TRIG, HDL, CHOLHDL, VLDL, LDLCALC  Current Medications: Current  Facility-Administered Medications  Medication Dose Route Frequency Provider Last Rate Last Dose  . azithromycin (ZITHROMAX) tablet 250 mg  250 mg Oral Daily Sharma Covert, MD      . cefdinir (OMNICEF) capsule 300 mg  300 mg Oral Q12H Sharma Covert, MD      . chlordiazePOXIDE (LIBRIUM) capsule 25 mg  25 mg Oral TID PRN Sharma Covert, MD      . diphenhydrAMINE (BENADRYL) injection 50 mg  50 mg Intramuscular Q6H PRN Sharma Covert, MD      . gabapentin (NEURONTIN) capsule 300 mg  300 mg Oral BID Sharma Covert, MD      . haloperidol (HALDOL) tablet 10 mg  10 mg Oral TID Sharma Covert, MD   10 mg at 08/24/18 1142   Or  . haloperidol lactate (HALDOL) injection 5 mg  5 mg Intramuscular TID Sharma Covert, MD      . hydrOXYzine (ATARAX/VISTARIL) tablet 25 mg  25 mg Oral TID PRN Rozetta Nunnery, NP   25 mg at 08/24/18 1142  . ibuprofen (ADVIL) tablet 800 mg  800 mg Oral  Q6H PRN Antonieta Pertlary, Greg Lawson, MD      . LORazepam (ATIVAN) tablet 2 mg  2 mg Oral Q4H PRN Antonieta Pertlary, Greg Lawson, MD   2 mg at 08/24/18 1142  . traZODone (DESYREL) tablet 50 mg  50 mg Oral QHS PRN,MR X 1 Nira ConnBerry, Jason A, NP   50 mg at 08/23/18 2210   PTA Medications: Medications Prior to Admission  Medication Sig Dispense Refill Last Dose  . acetaminophen (TYLENOL) 325 MG tablet Take 975 mg by mouth every 6 (six) hours as needed for headache (pain).     [name]. azithromycin (ZITHROMAX) 250 MG tablet Take 1 tablet (250 mg total) by mouth daily for 1 day. 2 each 0   . cefdinir (OMNICEF) 300 MG capsule Take 1 capsule (300 mg total) by mouth every 12 (twelve) hours.     . clonazePAM (KLONOPIN) 0.5 MG tablet Take 1 tablet (0.5 mg total) by mouth at bedtime. 30 tablet 0   . clonazePAM (KLONOPIN) 0.5 MG tablet Take 1 tablet (0.5 mg total) by mouth 2 (two) times daily as needed (anxiety; agitation). 30 tablet 0   . diphenhydrAMINE (BENADRYL) 25 MG tablet Take 50-75 mg by mouth at bedtime as needed for sleep.     [name]. gabapentin  (NEURONTIN) 300 MG capsule Take 1 capsule (300 mg total) by mouth 2 (two) times daily.     [name]. ibuprofen (ADVIL) 200 MG tablet Take 800 mg by mouth every 6 (six) hours as needed for headache (pain).     . traZODone (DESYREL) 100 MG tablet Take 1 tablet (100 mg total) by mouth at bedtime.       Musculoskeletal: Strength & Muscle Tone: within normal limits Gait & Station: normal Patient leans: N/A  Psychiatric Specialty Exam: Physical Exam  Nursing note and vitals reviewed. Constitutional: He is oriented to person, place, and time. He appears well-developed and well-nourished.  HENT:  Head: Normocephalic and atraumatic.  Respiratory: Effort normal.  Neurological: He is alert and oriented to person, place, and time.    ROS  Blood pressure (!) 159/108, pulse (!) 149, temperature 98.8 F (37.1 C), temperature source Oral, resp. rate 18, height 6\' 4"  (1.93 m), weight 93 kg, SpO2 99 %.Body mass index is 24.95 kg/m.  General Appearance: Disheveled  Eye Contact:  Fair  Speech:  Pressured  Volume:  Increased  Mood:  Angry, Anxious, Dysphoric and Irritable  Affect:  Labile  Thought Process:  Goal Directed and Descriptions of Associations: Loose  Orientation:  Negative  Thought Content:  Delusions, Hallucinations: Auditory and Paranoid Ideation  Suicidal Thoughts:  Yes.  with intent/plan  Homicidal Thoughts:  No  Memory:  Immediate;   Poor Recent;   Poor Remote;   Poor  Judgement:  Impaired  Insight:  Lacking  Psychomotor Activity:  Increased  Concentration:  Concentration: Poor and Attention Span: Poor  Recall:  Poor  Fund of Knowledge:  Fair  Language:  Fair  Akathisia:  Negative  Handed:  Right  AIMS (if indicated):     Assets:  Desire for Improvement Resilience  ADL's:  Impaired  Cognition:  WNL  Sleep:  Number of Hours: 4.75    Treatment Plan Summary: Daily contact with patient to assess and evaluate symptoms and progress in treatment, Medication management and Plan :  Patient is seen and examined.  Patient is a 33 year old male with a suspected past psychiatric history significant for schizophrenia who was transferred from medical Hospital on 08/23/2018.  The patient is currently psychotic,  paranoid and appearing to respond to internal stimuli.  He received Geodon this a.m. as well as lorazepam and Benadryl.  He has quieted to a degree, but we will continue haloperidol 10 mg p.o. 3 times daily IM or p.o.  He will also have available continued PRN's for lorazepam every 4 hours if agitation occurs.  Unfortunately his antibiotic medications were not continued, and I will restart those today.  We will attempt to contact his family for collateral information.  Observation Level/Precautions:  1 to 1 15 minute checks  Laboratory:  Chemistry Profile  Psychotherapy:    Medications:    Consultations:    Discharge Concerns:    Estimated LOS:  Other:     Physician Treatment Plan for Primary Diagnosis: <principal problem not specified> Long Term Goal(s): Improvement in symptoms so as ready for discharge  Short Term Goals: Ability to identify changes in lifestyle to reduce recurrence of condition will improve, Ability to verbalize feelings will improve, Ability to disclose and discuss suicidal ideas, Ability to demonstrate self-control will improve, Ability to identify and develop effective coping behaviors will improve, Ability to maintain clinical measurements within normal limits will improve, Compliance with prescribed medications will improve and Ability to identify triggers associated with substance abuse/mental health issues will improve  Physician Treatment Plan for Secondary Diagnosis: Active Problems:   MDD (major depressive disorder), severe (HCC)  Long Term Goal(s): Improvement in symptoms so as ready for discharge  Short Term Goals: Ability to identify changes in lifestyle to reduce recurrence of condition will improve, Ability to verbalize feelings will  improve, Ability to disclose and discuss suicidal ideas, Ability to demonstrate self-control will improve, Ability to identify and develop effective coping behaviors will improve, Ability to maintain clinical measurements within normal limits will improve, Compliance with prescribed medications will improve and Ability to identify triggers associated with substance abuse/mental health issues will improve  I certify that inpatient services furnished can reasonably be expected to improve the patient's condition.    Antonieta PertGreg Lawson Clary, MD 7/21/20203:54 PM

## 2018-08-25 DIAGNOSIS — F200 Paranoid schizophrenia: Secondary | ICD-10-CM

## 2018-08-25 LAB — CULTURE, BLOOD (ROUTINE X 2)
Culture: NO GROWTH
Culture: NO GROWTH
Special Requests: ADEQUATE
Special Requests: ADEQUATE

## 2018-08-25 MED ORDER — BENZTROPINE MESYLATE 1 MG PO TABS
1.0000 mg | ORAL_TABLET | Freq: Two times a day (BID) | ORAL | Status: DC
  Administered 2018-08-25 – 2018-08-31 (×12): 1 mg via ORAL
  Filled 2018-08-25 (×14): qty 1

## 2018-08-25 MED ORDER — LORAZEPAM 2 MG/ML IJ SOLN
INTRAMUSCULAR | Status: AC
  Administered 2018-08-25: 14:00:00 4 mg via INTRAMUSCULAR
  Filled 2018-08-25: qty 2

## 2018-08-25 MED ORDER — TEMAZEPAM 30 MG PO CAPS
30.0000 mg | ORAL_CAPSULE | Freq: Every day | ORAL | Status: DC
  Administered 2018-08-25: 30 mg via ORAL
  Filled 2018-08-25: qty 3

## 2018-08-25 MED ORDER — GABAPENTIN 300 MG PO CAPS
300.0000 mg | ORAL_CAPSULE | Freq: Three times a day (TID) | ORAL | Status: DC
  Administered 2018-08-25 – 2018-08-28 (×9): 300 mg via ORAL
  Filled 2018-08-25 (×13): qty 1

## 2018-08-25 MED ORDER — ZIPRASIDONE MESYLATE 20 MG IM SOLR
INTRAMUSCULAR | Status: AC
  Administered 2018-08-25: 14:00:00 20 mg via INTRAMUSCULAR
  Filled 2018-08-25: qty 20

## 2018-08-25 MED ORDER — ZIPRASIDONE MESYLATE 20 MG IM SOLR
20.0000 mg | Freq: Once | INTRAMUSCULAR | Status: AC
  Administered 2018-08-25: 14:00:00 20 mg via INTRAMUSCULAR
  Filled 2018-08-25: qty 20

## 2018-08-25 MED ORDER — LORAZEPAM 2 MG/ML IJ SOLN
4.0000 mg | Freq: Once | INTRAMUSCULAR | Status: AC
  Administered 2018-08-25: 14:00:00 4 mg via INTRAMUSCULAR

## 2018-08-25 NOTE — Progress Notes (Signed)
1:1 Progress Note D: Pt currently asleep in the bed. Patient appropriate to situation. Pt in no current distress.  A: Sitter is currently at bedside. R: Pt remains safe on a 1:1 per MD orders.  

## 2018-08-25 NOTE — Progress Notes (Signed)
Recreation Therapy Notes  Date: 08/25/2018 Time: 10:00 am Location: 500 hall   Group Topic: Introduction into Anxiety  Goal Area(s) Addresses:  Patient will work on worksheet on Introduction into Anxiety. Patient will follow directions on first prompt.  Behavioral Response: Appropriate  Intervention: Worksheet  Activity:  Staff on [address] hall were provided with a worksheet on Introduction into Anxiety. Staff was instructed to give it to the patients and have them work on it in place of [place]. Staff was also given 2 coloring sheets and 2 word searches and were given the option to give them out.  Education:  Ability to follow Directions, Change of thought processes Discharge Planning, Goal Planning.   Education Outcome: Acknowledges education/In group clarification offered  Clinical Observations/Feedback: . Due to COVID-19, guidelines group was not held. Group members were provided a learning activity worksheet to work on the topic and above-stated goals. LRT is available to answer any questions patient may have regarding the worksheet.  Tomi Likens, LRT/CTRS         Richard [name] Richard [name] 08/25/2018 2:31 PM

## 2018-08-25 NOTE — [facility] Counselor (Signed)
CSW attempted to do assessment with pt but pt is asleep due to medications.

## 2018-08-25 NOTE — Tx Team (Signed)
Interdisciplinary Treatment and Diagnostic Plan Update  08/25/2018 Time of Session: 09;14am [name] MRN: [id number]  Principal Diagnosis: <principal problem not specified>  Secondary Diagnoses: Active Problems:   MDD (major depressive disorder), severe (HCC)   Current Medications:  Current Facility-Administered Medications  Medication Dose Route Frequency Provider Last Rate Last Dose  . azithromycin (ZITHROMAX) tablet 250 mg  250 mg Oral Daily Sharma Covert, MD   250 mg at 08/25/18 0830  . cefdinir (OMNICEF) capsule 300 mg  300 mg Oral Q12H Sharma Covert, MD   300 mg at 08/25/18 0830  . chlordiazePOXIDE (LIBRIUM) capsule 25 mg  25 mg Oral TID PRN Sharma Covert, MD   25 mg at 08/25/18 0830  . diphenhydrAMINE (BENADRYL) injection 50 mg  50 mg Intramuscular Q6H PRN Sharma Covert, MD      . gabapentin (NEURONTIN) capsule 300 mg  300 mg Oral BID Sharma Covert, MD   300 mg at 08/25/18 0830  . haloperidol (HALDOL) tablet 10 mg  10 mg Oral TID Sharma Covert, MD   10 mg at 08/24/18 2117   Or  . haloperidol lactate (HALDOL) injection 5 mg  5 mg Intramuscular TID Sharma Covert, MD      . hydrOXYzine (ATARAX/VISTARIL) tablet 25 mg  25 mg Oral TID PRN Rozetta Nunnery, NP   25 mg at 08/25/18 0830  . ibuprofen (ADVIL) tablet 800 mg  800 mg Oral Q6H PRN Sharma Covert, MD      . LORazepam (ATIVAN) tablet 2 mg  2 mg Oral Q4H PRN Sharma Covert, MD   2 mg at 08/25/18 0630  . traZODone (DESYREL) tablet 50 mg  50 mg Oral QHS PRN,MR X 1 Lindon Romp A, NP   50 mg at 08/24/18 2117   PTA Medications: Medications Prior to Admission  Medication Sig Dispense Refill Last Dose  . acetaminophen (TYLENOL) 325 MG tablet Take 975 mg by mouth every 6 (six) hours as needed for headache (pain).     . [EXPIRED] azithromycin (ZITHROMAX) 250 MG tablet Take 1 tablet (250 mg total) by mouth daily for 1 day. 2 each 0   . cefdinir (OMNICEF) 300 MG capsule Take 1 capsule (300 mg  total) by mouth every 12 (twelve) hours.     . clonazePAM (KLONOPIN) 0.5 MG tablet Take 1 tablet (0.5 mg total) by mouth at bedtime. 30 tablet 0   . clonazePAM (KLONOPIN) 0.5 MG tablet Take 1 tablet (0.5 mg total) by mouth 2 (two) times daily as needed (anxiety; agitation). 30 tablet 0   . diphenhydrAMINE (BENADRYL) 25 MG tablet Take 50-75 mg by mouth at bedtime as needed for sleep.     [name] gabapentin (NEURONTIN) 300 MG capsule Take 1 capsule (300 mg total) by mouth 2 (two) times daily.     [name] ibuprofen (ADVIL) 200 MG tablet Take 800 mg by mouth every 6 (six) hours as needed for headache (pain).     . traZODone (DESYREL) 100 MG tablet Take 1 tablet (100 mg total) by mouth at bedtime.       Patient Stressors: Loss of friend by OD  Patient Strengths: General fund of knowledge Motivation for treatment/growth Physical Health Supportive family/friends  Treatment Modalities: Medication Management, Group therapy, Case management,  1 to 1 session with clinician, Psychoeducation, Recreational therapy.   Physician Treatment Plan for Primary Diagnosis: <principal problem not specified> Long Term Goal(s): Improvement in symptoms so as ready for discharge Improvement in  symptoms so as ready for discharge   Short Term Goals: Ability to identify changes in lifestyle to reduce recurrence of condition will improve Ability to verbalize feelings will improve Ability to disclose and discuss suicidal ideas Ability to demonstrate self-control will improve Ability to identify and develop effective coping behaviors will improve Ability to maintain clinical measurements within normal limits will improve Compliance with prescribed medications will improve Ability to identify triggers associated with substance abuse/mental health issues will improve Ability to identify changes in lifestyle to reduce recurrence of condition will improve Ability to verbalize feelings will improve Ability to disclose and discuss  suicidal ideas Ability to demonstrate self-control will improve Ability to identify and develop effective coping behaviors will improve Ability to maintain clinical measurements within normal limits will improve Compliance with prescribed medications will improve Ability to identify triggers associated with substance abuse/mental health issues will improve  Medication Management: Evaluate patient's response, side effects, and tolerance of medication regimen.  Therapeutic Interventions: 1 to 1 sessions, Unit Group sessions and Medication administration.  Evaluation of Outcomes: Not Progressing  Physician Treatment Plan for Secondary Diagnosis: Active Problems:   MDD (major depressive disorder), severe (HCC)  Long Term Goal(s): Improvement in symptoms so as ready for discharge Improvement in symptoms so as ready for discharge   Short Term Goals: Ability to identify changes in lifestyle to reduce recurrence of condition will improve Ability to verbalize feelings will improve Ability to disclose and discuss suicidal ideas Ability to demonstrate self-control will improve Ability to identify and develop effective coping behaviors will improve Ability to maintain clinical measurements within normal limits will improve Compliance with prescribed medications will improve Ability to identify triggers associated with substance abuse/mental health issues will improve Ability to identify changes in lifestyle to reduce recurrence of condition will improve Ability to verbalize feelings will improve Ability to disclose and discuss suicidal ideas Ability to demonstrate self-control will improve Ability to identify and develop effective coping behaviors will improve Ability to maintain clinical measurements within normal limits will improve Compliance with prescribed medications will improve Ability to identify triggers associated with substance abuse/mental health issues will improve      Medication Management: Evaluate patient's response, side effects, and tolerance of medication regimen.  Therapeutic Interventions: 1 to 1 sessions, Unit Group sessions and Medication administration.  Evaluation of Outcomes: Not Progressing   RN Treatment Plan for Primary Diagnosis: <principal problem not specified> Long Term Goal(s): Knowledge of disease and therapeutic regimen to maintain health will improve  Short Term Goals: Ability to participate in decision making will improve, Ability to verbalize feelings will improve, Ability to disclose and discuss suicidal ideas, Ability to identify and develop effective coping behaviors will improve and Compliance with prescribed medications will improve  Medication Management: RN will administer medications as ordered by provider, will assess and evaluate patient's response and provide education to patient for prescribed medication. RN will report any adverse and/or side effects to prescribing provider.  Therapeutic Interventions: 1 on 1 counseling sessions, Psychoeducation, Medication administration, Evaluate responses to treatment, Monitor vital signs and CBGs as ordered, Perform/monitor CIWA, COWS, AIMS and Fall Risk screenings as ordered, Perform wound care treatments as ordered.  Evaluation of Outcomes: Not Progressing   LCSW Treatment Plan for Primary Diagnosis: <principal problem not specified> Long Term Goal(s): Safe transition to appropriate next level of care at discharge, Engage patient in therapeutic group addressing interpersonal concerns.  Short Term Goals: Engage patient in aftercare planning with referrals and resources and Increase skills for  wellness and recovery  Therapeutic Interventions: Assess for all discharge needs, 1 to 1 time with Social worker, Explore available resources and support systems, Assess for adequacy in community support network, Educate family and significant other(s) on suicide prevention, Complete  Psychosocial Assessment, Interpersonal group therapy.  Evaluation of Outcomes: Not Progressing   Progress in Treatment: Attending groups: No. Participating in groups: No. Taking medication as prescribed: Yes. Toleration medication: Yes. Family/Significant other contact made: No, will contact:  will contact if consent is given to contact Patient understands diagnosis: No. Discussing patient identified problems/goals with staff: Yes. Medical problems stabilized or resolved: Yes. Denies suicidal/homicidal ideation: Yes. Issues/concerns per patient self-inventory: No. Other:   New problem(s) identified: No, Describe:  None  New Short Term/Long Term Goal(s): Medication stabilization, elimination of SI thoughts, and development of a comprehensive mental wellness plan.   Patient Goals:    Discharge Plan or Barriers: CSW will continue to follow up for appropriate referrals and possible discharge planning  Reason for Continuation of Hospitalization: Aggression Anxiety Delusions  Medication stabilization  Estimated Length of Stay: 3-5 days  Attendees: Patient: 08/25/2018   Physician: Dr. Malvin JohnsBrian Farah, MD 08/25/2018   Nursing: Marton Redwoodoni, RN 08/25/2018   RN Care Manager: 08/25/2018   Social Worker: Stephannie PetersJasmine Croix Presley, LCSW 08/25/2018   Recreational Therapist:  08/25/2018   Other:  08/25/2018   Other:  08/25/2018   Other: 08/25/2018       Scribe for Treatment Team: Delphia GratesJasmine M Degan Hanser, LCSW 08/25/2018 10:08 AM

## 2018-08-25 NOTE — Progress Notes (Signed)
[facility]  08/25/2018 11:34 AM Coral ElseCarl G Fish  MRN:  [id number] Subjective:   This 33 year old patient was transferred to the [facility] [address] due to bizarre behavior/paranoia/barricading her room, he continues to state he does not feel safe "with his own thoughts" was somewhat self agitating but then his medication seem to kick in needs more obtunded during my interview.  He is alert and sluggish oriented to person place situation stating he hears voices outside of his head telling him to kill himself and he states numerous other just vague symptoms fragments.  He denies visual hallucinations he denies a past psychiatric history although again this is unlikely. He remains on one-to-one precautions meds are reviewed to the best he can understand them he does seem to/no EPS or TD but just sluggishness on exam Principal Problem: Empirically being treated for chronic and untreated psychotic disorder Diagnosis: Active Problems:   MDD (major depressive disorder), severe (HCC)   Paranoid schizophrenia (HCC)  Total Time spent with patient: 20 minutes  Past Medical History: History reviewed. No pertinent past medical history. History reviewed. No pertinent surgical history. Family History: History reviewed. No pertinent family history. Family Psychiatric  History: neg Social History:  Social History   Substance and Sexual Activity  Alcohol Use  . Frequency: Never     Social History   Substance and Sexual Activity  Drug Use Not on file    Social History   Socioeconomic History  . Marital status: Single    Spouse name: Not on file  . Number of children: Not on file  . Years of education: Not on file  . Highest education level: Not on file  Occupational History  . Not on file  Social Needs  . Financial resource strain: Not on file  . Food insecurity    Worry: Not on file    Inability: Not on file  . Transportation needs    Medical: Not on file    Non-medical: Not on file  Tobacco  Use  . Smoking status: Never Smoker  . Smokeless tobacco: Never Used  Substance and Sexual Activity  . Alcohol Use    Frequency: Never  . Drug use: Not on file  . Sexual activity: Yes  Lifestyle  . Physical activity    Days per week: Not on file    Minutes per session: Not on file  . Stress: Not on file  Relationships  . Social Musicianconnections    Talks on phone: Not on file    Gets together: Not on file    Attends religious service: Not on file    Active member of club or organization: Not on file    Attends meetings of clubs or organizations: Not on file    Relationship status: Not on file  Other Topics Concern  . Not on file  Social History Narrative  . Not on file   Additional Social History:                         Sleep: Fair  Appetite:  Fair  Current Medications: Current Facility-Administered Medications  Medication Dose Route Frequency Provider Last Rate Last Dose  . azithromycin (ZITHROMAX) tablet 250 mg  250 mg Oral Daily Antonieta Pertlary, Greg Lawson, MD   250 mg at 08/25/18 0830  . cefdinir (OMNICEF) capsule 300 mg  300 mg Oral Q12H Antonieta Pertlary, Greg Lawson, MD   300 mg at 08/25/18 0830  . chlordiazePOXIDE (LIBRIUM) capsule 25 mg  25 mg Oral TID PRN Sharma Covert, MD   25 mg at 08/25/18 0830  . diphenhydrAMINE (BENADRYL) injection 50 mg  50 mg Intramuscular Q6H PRN Sharma Covert, MD   50 mg at 08/25/18 1053  . gabapentin (NEURONTIN) capsule 300 mg  300 mg Oral BID Sharma Covert, MD   300 mg at 08/25/18 0830  . haloperidol (HALDOL) tablet 10 mg  10 mg Oral TID Sharma Covert, MD   10 mg at 08/24/18 2117   Or  . haloperidol lactate (HALDOL) injection 5 mg  5 mg Intramuscular TID Sharma Covert, MD      . hydrOXYzine (ATARAX/VISTARIL) tablet 25 mg  25 mg Oral TID PRN Rozetta Nunnery, NP   25 mg at 08/25/18 0830  . ibuprofen (ADVIL) tablet 800 mg  800 mg Oral Q6H PRN Sharma Covert, MD      . LORazepam (ATIVAN) tablet 2 mg  2 mg Oral Q4H PRN Sharma Covert, MD   2 mg at 08/25/18 1053  . traZODone (DESYREL) tablet 50 mg  50 mg Oral QHS PRN,MR X 1 Lindon Romp A, NP   50 mg at 08/24/18 2117    Lab Results: No results found for this or any previous visit (from the past 48 hour(s)).  Blood Alcohol level:  Lab Results  Component Value Date   ETH <10 15/17/6160    Metabolic Disorder Labs: No results found for: HGBA1C, MPG No results found for: PROLACTIN No results found for: CHOL, TRIG, HDL, CHOLHDL, VLDL, LDLCALC  Physical Findings: AIMS: Facial and Oral Movements Muscles of Facial Expression: None, normal Lips and Perioral Area: None, normal Jaw: None, normal Tongue: None, normal,Extremity Movements Upper (arms, wrists, hands, fingers): None, normal Lower (legs, knees, ankles, toes): None, normal, Trunk Movements Neck, shoulders, hips: None, normal, Overall Severity Severity of abnormal movements (highest score from questions above): None, normal Incapacitation due to abnormal movements: None, normal Patient's awareness of abnormal movements (rate only patient's report): No Awareness, Dental Status Current problems with teeth and/or dentures?: No Does patient usually wear dentures?: No  CIWA:    COWS:     Musculoskeletal: Strength & Muscle Tone: within normal limits Gait & Station: normal Patient leans: N/A  Psychiatric Specialty Exam: Physical Exam  ROS  Blood pressure (!) 151/100, pulse (!) 123, temperature 98.8 F (37.1 C), temperature source Oral, resp. rate 20, height 6\' 4"  (1.93 m), weight 93 kg, SpO2 99 %.Body mass index is 24.95 kg/m.  General Appearance: Disheveled  Eye Contact:  None  Speech:  Garbled, Slow and Slurred  Volume:  Decreased  Mood:  Dysphoric  Affect:  Restricted  Thought Process:  Descriptions of Associations: Tangential  Orientation:    Thought Content:  Illogical  Suicidal Thoughts:  Yes.  without intent/plan  Homicidal Thoughts:  No  Memory:  Immediate;   Poor  Judgement:   Impaired  Insight:  Shallow  Psychomotor Activity:  Decreased  Concentration:  Concentration: Poor  Recall:  Poor  Fund of Knowledge:  Poor  Language:  Fair  Akathisia:  Negative  Handed:  Right  AIMS (if indicated):     Assets:  Physical Health Resilience  ADL's:  Intact  Cognition:  WNL  Sleep:  Number of Hours: 6.75  Patient elaborated recently that he had thoughts of bashing his head against the floor to commit suicide but can contract now and states he has no such plans reports auditory hallucinations.  But sluggish sedate  poor eye contact   Treatment Plan Summary: Daily contact with patient to assess and evaluate symptoms and progress in treatment and Medication management begin standing antipsychotic therapy, continue one-to-one precautions for now.  Continue med and illness education and also seek diagnostic clarity again patient's symptoms seem somewhat ill-defined on presentation and his history remains ill-defined.  Malvin JohnsFARAH,Arva Slaugh, MD 08/25/2018, 11:34 AM

## 2018-08-25 NOTE — CIRT (Signed)
CODE STARR activated for this patient at 1405.  The patient has remained on 1.1 throughout this shift. Staff report that patient became agitated and he stated '' I'm psychotic I want to die I want to die, and started stuffing the blanket into his mouth, stating ''I'M psychotic , I don't want to be here, I want to die , I want to die '' staff began grabbing blanket from the patient and then activated code [place]. The patient then slid to the floor and started banging his head stating '' I want to die. '' staff initiated manual hold for patient safety, and placed pillow for patient to maintain safety for his head. Manual hold from 1405 to 1420. MD notified and orders received. Geodon 20mg  IM and Ativan 4mg  IM stat. Patient continued to close eyes and then refuse to follow staff direction, turning his body in awkward angles. IM medication given without issue pt tolerated well. Please refer to [facility]. Pt rolled to his side, V/S obtained and noted HR 130. Sats 100% and no evidence of acute decompensation. The patient continued to keep his eyes closed, and then tried to softly rock his head, he was redirected to stop. MD made aware of above. Pt remains on 1.1 for patient safety. Pt remains in the care of primary RN. Will continue to monitor pt is safe on the unit.

## 2018-08-25 NOTE — Progress Notes (Signed)
Patient has been being monitored by 1:1 staff without incident.  Patient has reported feeling increased auditory hallucinations. Fear of loss due to losing friends and thoughts of self harm which he planned to bang his head against the wall.  Patient has received IM prn medications and has been calm and resting in bed.   Assess patient for safety, offer medications as prescribed, encourage patient in use effective coping skills.  Patient able to maintain safety with 1:1 assistance.

## 2018-08-26 MED ORDER — HALOPERIDOL 5 MG PO TABS
15.0000 mg | ORAL_TABLET | Freq: Three times a day (TID) | ORAL | Status: DC
  Administered 2018-08-26 – 2018-08-31 (×15): 15 mg via ORAL
  Filled 2018-08-26 (×19): qty 3

## 2018-08-26 MED ORDER — TEMAZEPAM 30 MG PO CAPS
60.0000 mg | ORAL_CAPSULE | Freq: Every day | ORAL | Status: DC
  Administered 2018-08-26 – 2018-08-30 (×5): 60 mg via ORAL
  Filled 2018-08-26 (×4): qty 2
  Filled 2018-08-26: qty 4

## 2018-08-26 MED ORDER — HALOPERIDOL LACTATE 5 MG/ML IJ SOLN
5.0000 mg | Freq: Three times a day (TID) | INTRAMUSCULAR | Status: DC
  Filled 2018-08-26 (×21): qty 1

## 2018-08-26 NOTE — Progress Notes (Addendum)
[place] NOVEL CORONAVIRUS (COVID-19) DAILY CHECK-OFF SYMPTOMS - answer yes or no to each - every day NO YES  Have you had a fever in the past 24 hours?  . Fever (Temp > 37.80C / 100F) X   Have you had any of these symptoms in the past 24 hours? . New Cough .  Sore Throat  .  Shortness of Breath .  Difficulty Breathing .  Unexplained Body Aches   X   Have you had any one of these symptoms in the past 24 hours not related to allergies?   . Runny Nose .  Nasal Congestion .  Sneezing   X   If you have had runny nose, nasal congestion, sneezing in the past 24 hours, has it worsened?  X   EXPOSURES - check yes or no X   Have you traveled outside the state in the past 14 days?  X   Have you been in contact with someone with a confirmed diagnosis of COVID-19 or PUI in the past 14 days without wearing appropriate PPE?  X   Have you been living in the same home as a person with confirmed diagnosis of COVID-19 or a PUI (household contact)?    X   Have you been diagnosed with COVID-19?    X              What to do next: Answered NO to all: Answered YES to anything:   Proceed with unit schedule Follow the [facility] Inpatient Flowsheet.   

## 2018-08-26 NOTE — Progress Notes (Signed)
Psychoeducational Group Note  Date:  08/26/2018 Time:  2026  Group Topic/Focus:  Wrap-Up Group:   The focus of this group is to help patients review their daily goal of treatment and discuss progress on daily workbooks.  Participation Level: Did Not Attend  Participation Quality:  Not Applicable  Affect:  Not Applicable  Cognitive:  Not Applicable  Insight:  Not Applicable  Engagement in Group: Not Applicable  Additional Comments:  The patient did not attend group this evening since he was asleep in his bedroom.   Archie Balboa S 08/26/2018, 8:26 PM

## 2018-08-26 NOTE — Progress Notes (Signed)
Recreation Therapy Notes  Date: 08/26/2018 Time: 10:00 am Location: [address] hall   Group Topic: Goal planning  Goal Area(s) Addresses:  Patient will work on worksheet on Goal planning. Patient will follow directions on first prompt.  Behavioral Response: Appropriate  Intervention: Worksheet  Activity:  Staff on [address] were provided with a worksheet on Goal planning. Staff was instructed to give it to the patients and have them work on it in place of [place]. Staff was also given 2 coloring sheets and 2 word searches and were given the option to give them out.  Education:  Ability to follow Directions, Change of thought processes Discharge Planning, Goal Planning.   Education Outcome: Acknowledges education/In group clarification offered  Clinical Observations/Feedback: . Due to COVID-19, guidelines group was not held. Group members were provided a learning activity worksheet to work on the topic and above-stated goals. LRT is available to answer any questions patient may have regarding the worksheet.  Tomi Likens, LRT/CTRS         Richard [name] 08/26/2018 1:15 PM

## 2018-08-26 NOTE — Progress Notes (Signed)
1:1 Progress Note D: Pt currently asleep in the bed. Patient appropriate to situation. Pt in no current distress.  A: Sitter is currently at bedside. R: Pt remains safe on a 1:1 per MD orders.  

## 2018-08-26 NOTE — Progress Notes (Signed)
[facility]  08/26/2018 8:14 AM Coral ElseCarl G Kirtz  MRN:  [id number] Subjective:    Patient continues to endorse thoughts of self-harm required seclusion without restraint, somewhat agitated by another patient who is manic and yelling and requiring IM medications  Patient tells me he wants to "die" and yesterday had attempted to harm himself as discussed and notes and required therapeutic hold. Further required IM medication/chemical restraint.  At the present time were dealing with a psychotic patient who is extremely determined toward self-harm  Principal Problem: Chronic untreated schizophrenia diagnosis: Active Problems:   MDD (major depressive disorder), severe (HCC)   Paranoid schizophrenia (HCC)  Total Time spent with patient: 20 minutes  Past Psychiatric History: Patient unable to elaborate fully  Past Medical History: History reviewed. No pertinent past medical history. History reviewed. No pertinent surgical history. Family History: History reviewed. No pertinent family history. Family Psychiatric  History: Patient unable to elaborate fully Social History:  Social History   Substance and Sexual Activity  Alcohol Use  . Frequency: Never     Social History   Substance and Sexual Activity  Drug Use Not on file    Social History   Socioeconomic History  . Marital status: Single    Spouse name: Not on file  . Number of children: Not on file  . Years of education: Not on file  . Highest education level: Not on file  Occupational History  . Not on file  Social Needs  . Financial resource strain: Not on file  . Food insecurity    Worry: Not on file    Inability: Not on file  . Transportation needs    Medical: Not on file    Non-medical: Not on file  Tobacco Use  . Smoking status: Never Smoker  . Smokeless tobacco: Never Used  Substance and Sexual Activity  . Alcohol Use    Frequency: Never  . Drug use: Not on file  . Sexual activity: Yes  Lifestyle  .  Physical activity    Days per week: Not on file    Minutes per session: Not on file  . Stress: Not on file  Relationships  . Social Musicianconnections    Talks on phone: Not on file    Gets together: Not on file    Attends religious service: Not on file    Active member of club or organization: Not on file    Attends meetings of clubs or organizations: Not on file    Relationship status: Not on file  Other Topics Concern  . Not on file  Social History Narrative  . Not on file   Additional Social History:                         Sleep: Fair  Appetite:  Fair  Current Medications: Current Facility-Administered Medications  Medication Dose Route Frequency Provider Last Rate Last Dose  . azithromycin (ZITHROMAX) tablet 250 mg  250 mg Oral Daily Antonieta Pertlary, Greg Lawson, MD   250 mg at 08/26/18 0739  . benztropine (COGENTIN) tablet 1 mg  1 mg Oral BID Malvin JohnsFarah, Marik Sedore, MD   1 mg at 08/26/18 0739  . cefdinir (OMNICEF) capsule 300 mg  300 mg Oral Q12H Antonieta Pertlary, Greg Lawson, MD   300 mg at 08/26/18 [id number]  . chlordiazePOXIDE (LIBRIUM) capsule 25 mg  25 mg Oral TID PRN Antonieta Pertlary, Greg Lawson, MD   25 mg at 08/25/18 0830  . diphenhydrAMINE (BENADRYL)  injection 50 mg  50 mg Intramuscular Q6H PRN Antonieta Pertlary, Greg Lawson, MD   50 mg at 08/25/18 1053  . gabapentin (NEURONTIN) capsule 300 mg  300 mg Oral TID Malvin JohnsFarah, Christyna Letendre, MD   300 mg at 08/26/18 [id number]  . haloperidol (HALDOL) tablet 15 mg  15 mg Oral TID Malvin JohnsFarah, Cyanna Neace, MD       Or  . haloperidol lactate (HALDOL) injection 5 mg  5 mg Intramuscular TID Malvin JohnsFarah, Mercer Peifer, MD      . hydrOXYzine (ATARAX/VISTARIL) tablet 25 mg  25 mg Oral TID PRN Jackelyn PolingBerry, Jason A, NP   25 mg at 08/26/18 0738  . ibuprofen (ADVIL) tablet 800 mg  800 mg Oral Q6H PRN Antonieta Pertlary, Greg Lawson, MD      . LORazepam (ATIVAN) tablet 2 mg  2 mg Oral Q4H PRN Antonieta Pertlary, Greg Lawson, MD   2 mg at 08/26/18 [id number]  . temazepam (RESTORIL) capsule 60 mg  60 mg Oral QHS Malvin JohnsFarah, Carter Kassel, MD      . traZODone (DESYREL) tablet 50  mg  50 mg Oral QHS PRN,MR X 1 Nira ConnBerry, Jason A, NP   50 mg at 08/24/18 2117    Lab Results: No results found for this or any previous visit (from the past 48 hour(s)).  Blood Alcohol level:  Lab Results  Component Value Date   ETH <10 08/20/2018    Metabolic Disorder Labs: No results found for: HGBA1C, MPG No results found for: PROLACTIN No results found for: CHOL, TRIG, HDL, CHOLHDL, VLDL, LDLCALC  Physical Findings: AIMS: Facial and Oral Movements Muscles of Facial Expression: None, normal Lips and Perioral Area: None, normal Jaw: None, normal Tongue: None, normal,Extremity Movements Upper (arms, wrists, hands, fingers): None, normal Lower (legs, knees, ankles, toes): None, normal, Trunk Movements Neck, shoulders, hips: None, normal, Overall Severity Severity of abnormal movements (highest score from questions above): None, normal Incapacitation due to abnormal movements: None, normal Patient's awareness of abnormal movements (rate only patient's report): No Awareness, Dental Status Current problems with teeth and/or dentures?: No Does patient usually wear dentures?: No  CIWA:    COWS:     Musculoskeletal: Strength & Muscle Tone: within normal limits Gait & Station: normal Patient leans: N/A  Psychiatric Specialty Exam: Physical Exam  ROS  Blood pressure (!) 141/97, pulse 92, temperature 98.8 F (37.1 C), temperature source Oral, resp. rate 18, height 6\' 4"  (1.93 m), weight 93 kg, SpO2 96 %.Body mass index is 24.95 kg/m.  General Appearance: Casual  Eye Contact:  None  Speech:  Slow and Slurred  Volume:  Decreased  Mood:  Dysphoric  Affect:  Restricted  Thought Process:  Linear and Descriptions of Associations: Loose  Orientation:  Full (Time, Place, and Person)  Thought Content:  Illogical and Obsessions  Suicidal Thoughts:  Yes.  with intent/plan  Homicidal Thoughts:  No  Memory:  Recent;   Poor  Judgement:  Poor  Insight:  Shallow  Psychomotor Activity:   Decreased  Concentration:  Concentration: Poor  Recall:  Poor  Fund of Knowledge:  Poor  Language:  Poor  Akathisia:  Negative  Handed:  Right  AIMS (if indicated):     Assets:  Physical Health Resilience  ADL's:  Intact  Cognition:  WNL  Sleep:  Number of Hours: 6.75     Treatment Plan Summary: Daily contact with patient to assess and evaluate symptoms and progress in treatment, Medication management and Plan Escalate Haldol for treatment of psychosis and for behavioral management to 15 mg  3 times daily continue current precautions escalate sleep aid continue one-to-one and continue to monitor in the padded seclusion room without restraint  Johnn Hai, MD 08/26/2018, 8:14 AM

## 2018-08-26 NOTE — Progress Notes (Signed)
1:1 Note: Patient maintained on constant supervision for safety.  Patient is in bed with eyes closed.  Breathing is even and unlabored.  Routine safety checks maintained every 15 minutes.  Medication given as prescribed.  Patient is safe on the unit with supervision.

## 2018-08-26 NOTE — Progress Notes (Addendum)
Nursing 1:1 note D:Pt observed sleeping in bed with eyes closed. RR even and unlabored. No distress noted. A: 1:1 observation continues for safety  R: pt remains safe  

## 2018-08-26 NOTE — Progress Notes (Signed)
1:1 Note: Patient maintained on constant supervision for safety.  Continues to endorse suicidal thoughts, auditory and visual hallucinations.    Patient went after another patient for being loud on the unit.  Patient became angry and ripped his shirt off when redirected by staff.  Patient redirected back to his room. PRN medications given for agitation with good effect. Routine safety checks maintained every 15 minutes for safety.  Patient safe on the unit with supervision.

## 2018-08-26 NOTE — Progress Notes (Signed)
1:1 Note: Patient maintained on constant supervision for safety.  Patient is his room on the bed resting.  Routine safety checks maintained every 15 minutes.  Medication given as prescribed.  Offered support and encouragement as needed.  Patient is safe on the unit with supervision.

## 2018-08-27 MED ORDER — FLUOXETINE HCL 20 MG PO CAPS
20.0000 mg | ORAL_CAPSULE | Freq: Every day | ORAL | Status: DC
  Administered 2018-08-27 – 2018-08-28 (×2): 20 mg via ORAL
  Filled 2018-08-27 (×3): qty 1

## 2018-08-27 NOTE — Progress Notes (Signed)
Pt was up stating he was getting agitated and wanted some medication. Per Corene Cornea NP- ok to give pt 0800 meds early.

## 2018-08-27 NOTE — [facility] Counselor (Signed)
CSW attempted to meet with patient for PSA assessment. Pt is currently asleep due to being medicated. This is CSW's 2nd attempt at trying to obtain PSA.

## 2018-08-27 NOTE — Progress Notes (Signed)
[facility] MD Progress Note  08/27/2018 11:09 AM Richard [name]  MRN:  [id number] Subjective:   Patient slept in today till about 1030 or so, he continues to be on 1-1 precautions.  He elaborates in a slow mumbling voice that he does hear voices telling him and "it is your time" he also states he hears "the lady in the room across the hall" and indeed there has been a manic patient they are quite loud.  She required IM medications but this clearly must of agitated this patient. So in summary this individual thought to have a schizophrenic condition that propelled him to jump off of a roof I asked him specifically about that and he states that something told him to do it inside.  He cannot be more specific does not appear to be a auditory hallucination propelling these behaviors. At this point he is sluggish oriented to person place situation repeatedly states "do not leave me" and is quite needy and fearful.  When asked if there is ever been a medication that was helpful for his symptomatology he cannot recall any but again he does not provide much information about his past psychiatric history we do not have any more information than was a originally obtained. No EPS or TD No self-harm behaviors does seem to understand what it is to contract Principal Problem: Psychosis and intense/recently near continual self-destructive behaviors Diagnosis: Active Problems:   MDD (major depressive disorder), severe (HCC)   Paranoid schizophrenia ([place])  Total Time spent with patient: 20 minutes  Past Medical History: History reviewed. No pertinent past medical history. History reviewed. No pertinent surgical history. Family History: History reviewed. No pertinent family history. Family Psychiatric  History: no data Social History:  Social History   Substance and Sexual Activity  Alcohol Use  . Frequency: Never     Social History   Substance and Sexual Activity  Drug Use Not on file    Social History    Socioeconomic History  . Marital status: Single    Spouse name: Not on file  . Number of children: Not on file  . Years of education: Not on file  . Highest education level: Not on file  Occupational History  . Not on file  Social Needs  . Financial resource strain: Not on file  . Food insecurity    Worry: Not on file    Inability: Not on file  . Transportation needs    Medical: Not on file    Non-medical: Not on file  Tobacco Use  . Smoking status: Never Smoker  . Smokeless tobacco: Never Used  Substance and Sexual Activity  . Alcohol Use    Frequency: Never  . Drug use: Not on file  . Sexual activity: Yes  Lifestyle  . Physical activity    Days per week: Not on file    Minutes per session: Not on file  . Stress: Not on file  Relationships  . Social Herbalist on phone: Not on file    Gets together: Not on file    Attends religious service: Not on file    Active member of club or organization: Not on file    Attends meetings of clubs or organizations: Not on file    Relationship status: Not on file  Other Topics Concern  . Not on file  Social History Narrative  . Not on file   Additional Social History:  Sleep: Fair  Appetite:  Fair  Current Medications: Current Facility-Administered Medications  Medication Dose Route Frequency Provider Last Rate Last Dose  . azithromycin (ZITHROMAX) tablet 250 mg  250 mg Oral Daily Antonieta Pertlary, Greg Lawson, MD   250 mg at 08/27/18 [id number]  . benztropine (COGENTIN) tablet 1 mg  1 mg Oral BID Richard [name], Rio Kidane, MD   1 mg at 08/27/18 0620  . cefdinir (OMNICEF) capsule 300 mg  300 mg Oral Q12H Antonieta Pertlary, Greg Lawson, MD   300 mg at 08/27/18 [id number]  . chlordiazePOXIDE (LIBRIUM) capsule 25 mg  25 mg Oral TID PRN Antonieta Pertlary, Greg Lawson, MD   25 mg at 08/25/18 0830  . diphenhydrAMINE (BENADRYL) injection 50 mg  50 mg Intramuscular Q6H PRN Antonieta Pertlary, Greg Lawson, MD   50 mg at 08/25/18 1053  . gabapentin  (NEURONTIN) capsule 300 mg  300 mg Oral TID Richard [name], Kongmeng Santoro, MD   300 mg at 08/27/18 1044  . haloperidol (HALDOL) tablet 15 mg  15 mg Oral TID Richard [name], Geraldyne Barraclough, MD   15 mg at 08/27/18 1044   Or  . haloperidol lactate (HALDOL) injection 5 mg  5 mg Intramuscular TID Richard [name], Hommer Cunliffe, MD      . hydrOXYzine (ATARAX/VISTARIL) tablet 25 mg  25 mg Oral TID PRN Jackelyn PolingBerry, Jason A, NP   25 mg at 08/26/18 0738  . ibuprofen (ADVIL) tablet 800 mg  800 mg Oral Q6H PRN Antonieta Pertlary, Greg Lawson, MD   800 mg at 08/26/18 1539  . LORazepam (ATIVAN) tablet 2 mg  2 mg Oral Q4H PRN Antonieta Pertlary, Greg Lawson, MD   2 mg at 08/27/18 1043  . temazepam (RESTORIL) capsule 60 mg  60 mg Oral QHS Richard [name], Laela Deviney, MD   60 mg at 08/26/18 2339  . traZODone (DESYREL) tablet 50 mg  50 mg Oral QHS PRN,MR X 1 Nira ConnBerry, Jason A, NP   50 mg at 08/24/18 2117    Lab Results: No results found for this or any previous visit (from the past 48 hour(s)).  Blood Alcohol level:  Lab Results  Component Value Date   ETH <10 08/20/2018    Metabolic Disorder Labs: No results found for: HGBA1C, MPG No results found for: PROLACTIN No results found for: CHOL, TRIG, HDL, CHOLHDL, VLDL, LDLCALC  Physical Findings: AIMS: Facial and Oral Movements Muscles of Facial Expression: None, normal Lips and Perioral Area: None, normal Jaw: None, normal Tongue: None, normal,Extremity Movements Upper (arms, wrists, hands, fingers): None, normal Lower (legs, knees, ankles, toes): None, normal, Trunk Movements Neck, shoulders, hips: None, normal, Overall Severity Severity of abnormal movements (highest score from questions above): None, normal Incapacitation due to abnormal movements: None, normal Patient's awareness of abnormal movements (rate only patient's report): No Awareness, Dental Status Current problems with teeth and/or dentures?: No Does patient usually wear dentures?: No  CIWA:    COWS:     Musculoskeletal: Strength & Muscle Tone: flaccid Gait & Station:  unsteady Patient leans: N/A  Psychiatric Specialty Exam: Physical Exam  ROS  Blood pressure (!) 141/97, pulse 92, temperature 98.8 F (37.1 C), temperature source Oral, resp. rate 18, height 6\' 4"  (1.93 m), weight 93 kg, SpO2 96 %.Body mass index is 24.95 kg/m.  General Appearance: Casual and Disheveled  Eye Contact:  Minimal  Speech:  Garbled, Slow and Slurred  Volume:  Decreased  Mood:  Dysphoric  Affect:  Congruent  Thought Process:  Coherent and Descriptions of Associations: Circumstantial  Orientation:  Other:  Person place situation  Thought Content:  Hallucinations: Auditory  Suicidal Thoughts:  states these thoughts are there all the time  Homicidal Thoughts:  No  Memory:  Recent;   Poor  Judgement:  Impaired  Insight:  Shallow  Psychomotor Activity:  Decreased  Concentration:  Concentration: Poor  Recall:  Poor  Fund of Knowledge:  Poor  Language:  Fair  Akathisia:  Negative  Handed:  Right  AIMS (if indicated):     Assets:  Physical Health Resilience  ADL's:  Intact  Cognition:  WNL  Sleep:  Number of Hours: 9.25     Treatment Plan Summary: Daily contact with patient to assess and evaluate symptoms and progress in treatment and Medication management for now we will continue the haloperidol/gabapentin combination, continue cognitive and reality based therapies continue current precautions.  Unfortunately he just needs to be medicated out of his self-harm for period of time until he can begin to respond to the medications.  Richard [name],Richard Kretz, MD 08/27/2018, 11:09 AM

## 2018-08-27 NOTE — Progress Notes (Signed)
Nursing 1:1 note D:Pt observed sleeping in bed with eyes closed. RR even and unlabored. No distress noted. A: 1:1 observation continues for safety  R: pt remains safe  

## 2018-08-27 NOTE — Progress Notes (Signed)
Patient ID: Richard [name], male   DOB: 02-Oct-1985, 33 y.o.   MRN: [id number]  1:1 Nursing Progress Note  D: Patient has been resting in bed for most of the day. Evening medications provided without incident. Environment is secured. Sitter observed with patient.  A: Patient remains on 1:1 observation per provider orders. Patient safety monitored with q15 minute safety checks.  R: Patient remains safe on the unit at this time. Will continue to monitor with 1:1 sitter, q15 min safety checks & q4 hour nursing assessments.

## 2018-08-27 NOTE — Progress Notes (Signed)
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 Patient will follow directions on first prompt.  Behavioral Response: Appropriate  Intervention: Worksheet  Activity:  Staff on [address] hall were provided with a worksheet on Strengths Exploration. Staff was instructed to give it to the patients and have them work on it in place of [place]. Staff was also given 2 coloring sheets and 2 word searches and were given the option to give them out.  Education:  Ability to follow Directions, Change of thought processes Discharge Planning, Goal Planning.   Education Outcome: Acknowledges education/In group clarification offered  Clinical Observations/Feedback: . Due to COVID-19, guidelines group was not held. Group members were provided a learning activity worksheet to work on the topic and above-stated goals. LRT is available to answer any questions patient may have regarding the worksheet.  Tomi Likens, LRT/CTRS         Maurene Hollin L Tamera Pingley 08/27/2018 12:05 PM

## 2018-08-27 NOTE — Plan of Care (Signed)
  Problem: Health Behavior/Discharge Planning: Goal: Compliance with treatment plan for underlying cause of condition will improve Outcome: Progressing   Problem: Safety: Goal: Periods of time without injury will increase Outcome: Progressing   

## 2018-08-27 NOTE — Progress Notes (Signed)
Patient ID: Richard [name], male   DOB: 09-04-1985, 33 y.o.   MRN: [id number]  1:1 Nursing Progress Note  D: Patient required 1200 medications early due to complaints of agitation. PRN Ativan 2mg  PO also provided with relief. Patient continues to report SI and AH. Patient currently rests in bed in no distress. Environment is secured. Sitter observed with patient.  A: Patient remains on 1:1 observation per provider orders. Patient safety monitored with q15 minute safety checks.  R: Patient remains safe on the unit at this time. Will continue to monitor with 1:1 sitter, q15 min safety checks & q4 hour nursing assessments.

## 2018-08-27 NOTE — Progress Notes (Signed)
Patient ID: Richard [name], male   DOB: 08-Jul-1985, 33 y.o.   MRN: [id number]  1:1 Nursing Progress Note  D: Patient is observed resting in bed for most of the morning. Per sitter, patient has continued to express suicidal ideations and has been attempting to choke his neck with his hands but is able to be redirected by staff. Patient continues to endorse auditory hallucinations and expresses fear "I might hurt myself or others". Patient has been triggered by other peers on the unit this morning. Currently, patient remains safe and is resting in bed. Environment is secured. Sitter observed with patient.  A: Patient remains on 1:1 observation per provider orders. Patient safety monitored with q15 minute safety checks.  R: Patient remains safe on the unit at this time. Will continue to monitor with 1:1 sitter, q15 min safety checks & q4 hour nursing assessments.

## 2018-08-27 NOTE — Progress Notes (Signed)
1:1 Nursing note- Patient is currently lying in bed asleep with sitter at bedside. No distress noted. Patient came up earlier to receive his hs medications and voiced no complaints. He declined snack and returned to his room.

## 2018-08-28 DIAGNOSIS — F339 Major depressive disorder, recurrent, unspecified: Secondary | ICD-10-CM

## 2018-08-28 MED ORDER — OLANZAPINE 5 MG PO TBDP
5.0000 mg | ORAL_TABLET | Freq: Three times a day (TID) | ORAL | Status: DC
  Administered 2018-08-28 – 2018-09-01 (×10): 5 mg via ORAL
  Filled 2018-08-28 (×17): qty 1

## 2018-08-28 MED ORDER — OLANZAPINE 5 MG PO TBDP
5.0000 mg | ORAL_TABLET | Freq: Once | ORAL | Status: AC
  Administered 2018-08-28: 10:00:00 5 mg via ORAL

## 2018-08-28 MED ORDER — FLUOXETINE HCL 20 MG PO CAPS
40.0000 mg | ORAL_CAPSULE | Freq: Every day | ORAL | Status: DC
  Filled 2018-08-28: qty 2

## 2018-08-28 MED ORDER — FLUOXETINE HCL 20 MG PO CAPS
20.0000 mg | ORAL_CAPSULE | Freq: Every day | ORAL | Status: DC
  Administered 2018-08-29 – 2018-09-01 (×4): 20 mg via ORAL
  Filled 2018-08-28 (×5): qty 1

## 2018-08-28 MED ORDER — GABAPENTIN 400 MG PO CAPS
400.0000 mg | ORAL_CAPSULE | Freq: Three times a day (TID) | ORAL | Status: DC
  Administered 2018-08-28 – 2018-08-31 (×9): 400 mg via ORAL
  Filled 2018-08-28 (×12): qty 1

## 2018-08-28 MED ORDER — OLANZAPINE 5 MG PO TBDP
5.0000 mg | ORAL_TABLET | Freq: Three times a day (TID) | ORAL | Status: DC | PRN
  Administered 2018-08-28 (×2): 5 mg via ORAL
  Filled 2018-08-28 (×2): qty 1

## 2018-08-28 NOTE — Progress Notes (Signed)
1:1 Note D: Patient c/o of his nerves getting to him. "I don't know where to go from here. I don't know what the outcome is for me." Patient still presenting depressed, experiencing SI. 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  A: Administered PRN dose of Zyprexa 5mg  ODT for anxiety/agitation. Patient appeared to be elevating. 1:1 continued for suicide precautions.  R: Patient continues to take meds PO. Compliant with med regimen. Agrees with need to get rest.

## 2018-08-28 NOTE — Progress Notes (Signed)
D: Patient presents agitated, confused, depressed. He endorses SI with a plan, but would not share the plan. He is paranoid and feels he is unsafe here. He is hearing and seeing things, but would not tell staff specifics. He ripped his curtain down in his room and could not explain why he did it. He also threw his water pitcher at staff. Medications were given, and he takes PO meds. Patient now sleeping calmly. A: Patient checked q15 min, and checks reviewed. Reviewed medication changes with patient and educated on side effects. Educated patient on importance of attending group therapy sessions and educated on several coping skills. Encouarged participation in milieu through recreation therapy and attending meals with peers. Support and encouragement provided. Fluids offered. R: Patient receptive to education on medications, and is medication compliant. Patient contracts for safety on the unit.

## 2018-08-28 NOTE — [facility] Group Notes (Signed)
  [facility] LCSW Group Therapy Note  Date/Time: 08/28/2018 @ 10am  Type of Therapy/Topic:  Group Therapy:  Emotion Regulation  Participation Level:  Did Not Attend   Mood: Did not attend   Description of Group:    The purpose of this group is to assist patients in learning to regulate negative emotions and experience positive emotions. Patients will be guided to discuss ways in which they have been vulnerable to their negative emotions. These vulnerabilities will be juxtaposed with experiences of positive emotions or situations, and patients challenged to use positive emotions to combat negative ones. Special emphasis will be placed on coping with negative emotions in conflict situations, and patients will process healthy conflict resolution skills.  Therapeutic Goals: 1. Patient will identify two positive emotions or experiences to reflect on in order to balance out negative emotions:  2. Patient will label two or more emotions that they find the most difficult to experience:  3. Patient will be able to demonstrate positive conflict resolution skills through discussion or role plays:   Summary of Patient Progress:   Patient did not attend group today.     Therapeutic Modalities:   Cognitive Behavioral Therapy Feelings Identification Dialectical Behavioral Therapy   Mallori Araque, LCSW  

## 2018-08-28 NOTE — Progress Notes (Addendum)
[facility]  08/28/2018 8:58 AM Richard ElseCarl G [name]  MRN:  [id number] Subjective:  "I'm tired."  Richard [name] found lying in bed. He appears fatigued. He received PRN Ativan earlier this morning for reported agitation and AH. He reports depressed mood, poor appetite and sleep. He reports demeaning AH that "tell me it is time and where I need to go." Denies CAH. Denies current SI/HI. Per chart review patient was attempting to choke himself with his hands yesterday but was redirected by staff. 1:1 monitoring continues. He is med compliant. Heart rate elevated at 112. Patient was encouraged to drink fluids as well as eating meals.   Addendum- Patient seen again per staff request. Per nursing report he suddenly became agitated, throwing things and ripped the curtains off the window. He received PRN Ativan 2 mg and PRN Zyprexa Zydis 5 mg along with scheduled Haldol 15 mg this AM. On assessment he is sitting in bed. He reports feeling calmer after PRN medication but is still reporting suicidal thoughts with thoughts of choking himself, as well as generalized homicidal thoughts to choke other people, directed at everyone. Denies AVH. He is unable to identify triggers. He is agreeable to taking additional medication.  From admission H&P: Patient is a 33 year old male with a probable past psychiatric history significant for psychotic disorder (presumably schizophrenia) [facility] after jumping from a two-story building with the intention of killing himself.The patient is currently psychotic, and ended up significantly agitated.   Principal Problem: <principal problem not specified> Diagnosis: Active Problems:   MDD (major depressive disorder), severe (HCC)   Paranoid schizophrenia (HCC)  Total Time spent with patient: 15 minutes  Past Psychiatric History: See admission H&P  Past Medical History: History reviewed. No pertinent past  medical history. History reviewed. No pertinent surgical history. Family History: History reviewed. No pertinent family history. Family Psychiatric  History: See admission H&P Social History:  Social History   Substance and Sexual Activity  Alcohol Use  . Frequency: Never     Social History   Substance and Sexual Activity  Drug Use Not on file    Social History   Socioeconomic History  . Marital status: Single    Spouse name: Not on file  . Number of children: Not on file  . Years of education: Not on file  . Highest education level: Not on file  Occupational History  . Not on file  Social Needs  . Financial resource strain: Not on file  . Food insecurity    Worry: Not on file    Inability: Not on file  . Transportation needs    Medical: Not on file    Non-medical: Not on file  Tobacco Use  . Smoking status: Never Smoker  . Smokeless tobacco: Never Used  Substance and Sexual Activity  . Alcohol Use    Frequency: Never  . Drug use: Not on file  . Sexual activity: Yes  Lifestyle  . Physical activity    Days per week: Not on file    Minutes per session: Not on file  . Stress: Not on file  Relationships  . Social Musicianconnections    Talks on phone: Not on file    Gets together: Not on file    Attends religious service: Not on file    Active member of club or organization: Not on file    Attends meetings of clubs or organizations: Not on file  Relationship status: Not on file  Other Topics Concern  . Not on file  Social History Narrative  . Not on file   Additional Social History:                         Sleep: Fair  Appetite:  Fair  Current Medications: Current Facility-Administered Medications  Medication Dose Route Frequency Provider Last Rate Last Dose  . azithromycin (ZITHROMAX) tablet 250 mg  250 mg Oral Daily Antonieta Pertlary, Greg Lawson, MD   250 mg at 08/28/18 0845  . benztropine (COGENTIN) tablet 1 mg  1 mg Oral BID Malvin JohnsFarah, Brian, MD   1 mg at  08/28/18 0846  . cefdinir (OMNICEF) capsule 300 mg  300 mg Oral Q12H Antonieta Pertlary, Greg Lawson, MD   300 mg at 08/28/18 0846  . diphenhydrAMINE (BENADRYL) injection 50 mg  50 mg Intramuscular Q6H PRN Antonieta Pertlary, Greg Lawson, MD   50 mg at 08/28/18 0510  . FLUoxetine (PROZAC) capsule 20 mg  20 mg Oral Daily Malvin JohnsFarah, Brian, MD   20 mg at 08/28/18 0846  . gabapentin (NEURONTIN) capsule 300 mg  300 mg Oral TID Malvin JohnsFarah, Brian, MD   300 mg at 08/28/18 0846  . haloperidol (HALDOL) tablet 15 mg  15 mg Oral TID Malvin JohnsFarah, Brian, MD   15 mg at 08/28/18 0845   Or  . haloperidol lactate (HALDOL) injection 5 mg  5 mg Intramuscular TID Malvin JohnsFarah, Brian, MD      . hydrOXYzine (ATARAX/VISTARIL) tablet 25 mg  25 mg Oral TID PRN Jackelyn PolingBerry, Jason A, NP   25 mg at 08/26/18 0738  . ibuprofen (ADVIL) tablet 800 mg  800 mg Oral Q6H PRN Antonieta Pertlary, Greg Lawson, MD   800 mg at 08/26/18 1539  . LORazepam (ATIVAN) tablet 2 mg  2 mg Oral Q4H PRN Antonieta Pertlary, Greg Lawson, MD   2 mg at 08/28/18 0433  . temazepam (RESTORIL) capsule 60 mg  60 mg Oral QHS Malvin JohnsFarah, Brian, MD   60 mg at 08/27/18 2039  . traZODone (DESYREL) tablet 50 mg  50 mg Oral QHS PRN,MR X 1 Nira ConnBerry, Jason A, NP   50 mg at 08/24/18 2117    Lab Results: No results found for this or any previous visit (from the past 48 hour(s)).  Blood Alcohol level:  Lab Results  Component Value Date   ETH <10 08/20/2018    Metabolic Disorder Labs: No results found for: HGBA1C, MPG No results found for: PROLACTIN No results found for: CHOL, TRIG, HDL, CHOLHDL, VLDL, LDLCALC  Physical Findings: AIMS: Facial and Oral Movements Muscles of Facial Expression: None, normal Lips and Perioral Area: None, normal Jaw: None, normal Tongue: None, normal,Extremity Movements Upper (arms, wrists, hands, fingers): None, normal Lower (legs, knees, ankles, toes): None, normal, Trunk Movements Neck, shoulders, hips: None, normal, Overall Severity Severity of abnormal movements (highest score from questions above): None,  normal Incapacitation due to abnormal movements: None, normal Patient's awareness of abnormal movements (rate only patient's report): No Awareness, Dental Status Current problems with teeth and/or dentures?: No Does patient usually wear dentures?: No  CIWA:    COWS:     Musculoskeletal: Strength & Muscle Tone: within normal limits Gait & Station: normal Patient leans: N/A  Psychiatric Specialty Exam: Physical Exam  Nursing note and vitals reviewed. Constitutional: He is oriented to person, place, and time. He appears well-developed and well-nourished.  Cardiovascular: Normal rate.  Respiratory: Effort normal.  Neurological: He is alert and oriented to  person, place, and time.    Review of Systems  Constitutional: Negative.   Psychiatric/Behavioral: Positive for depression, hallucinations and suicidal ideas. Negative for substance abuse. The patient is nervous/anxious. The patient does not have insomnia.     Blood pressure (!) 142/92, pulse (!) 112, temperature 98.8 F (37.1 C), temperature source Oral, resp. rate 18, height 6\' 4"  (1.93 m), weight 93 kg, SpO2 95 %.Body mass index is 24.95 kg/m.  General Appearance: Disheveled  Eye Contact:  Poor  Speech:  Slow  Volume:  Decreased  Mood:  Depressed  Affect:  Congruent  Thought Process:  Coherent  Orientation:  Full (Time, Place, and Person)  Thought Content:  Hallucinations: Auditory  Suicidal Thoughts:  No  Homicidal Thoughts:  No  Memory:  Immediate;   Fair Recent;   Fair  Judgement:  Fair  Insight:  Fair  Psychomotor Activity:  Decreased  Concentration:  Concentration: Fair  Recall:  [employer] of Knowledge:  Fair  Language:  Fair  Akathisia:  No  Handed:  Right  AIMS (if indicated):     Assets:  Communication Skills Desire for Improvement Resilience  ADL's:  Intact  Cognition:  WNL  Sleep:  Number of Hours: 5.25     Treatment Plan Summary: Daily contact with patient to assess and evaluate symptoms and  progress in treatment and Medication management   Continue inpatient hospitalization.  One-time dose of Zyprexa Zydis 5 mg PO now Start Zyprexa Zydis 5 mg PO TID and Q8HR PRN psychosis/agitation Increase gabapentin to 400 mg PO TID for anxiety/agitation Continue Haldol 15 mg PO TID for psychosis Continue Ativan 2 mg PO Q4HR PRN agitation Continue Prozac 20 mg PO daily for mood Continue Vistaril 25 mg PO TID PRN anxiety Continue Restoril 60 mg PO QHS for insomnia Continue trazodone 50 mg PO QHS PRN insomnia Continue Zithromax 250 mg PO daily for infection Continue cefdinir 300 mg PO BID for infection  Patient will participate in the therapeutic group milieu.  Discharge disposition in progress.   Connye Burkitt, NP 08/28/2018, 8:58 AM   Attest to NP progress note

## 2018-08-28 NOTE — Progress Notes (Signed)
1:1 nursing note - Patient is currently lying in bed asleep after receiving medications for agitation. He reported that he was hearing voices also. He actually walked up to medication window. Safety maintained with sitter at bedside.

## 2018-08-28 NOTE — Progress Notes (Signed)
   08/28/18 0845  COVID-19 Daily Checkoff  Have you had a fever (temp > 37.80C/100F)  in the past 24 hours?  No  If you have had runny nose, nasal congestion, sneezing in the past 24 hours, has it worsened? No  COVID-19 EXPOSURE  Have you traveled outside the state in the past 14 days? No  Have you been in contact with someone with a confirmed diagnosis of COVID-19 or PUI in the past 14 days without wearing appropriate PPE? No  Have you been living in the same home as a person with confirmed diagnosis of COVID-19 or a PUI (household contact)? No  Have you been diagnosed with COVID-19? No

## 2018-08-28 NOTE — Progress Notes (Signed)
D: Patient is sleeping in bed, lying on right side. He is in NAD. Respirations even and unlabored. He was throwing things in his room, including his water pitcher earlier today. A: 1:1 continued for suicide precautions and patient safety. High violence risk, based on prior STARR activations with patient.  R: Patient continues to take medications by mouth. Compliant. He voices no complaints.

## 2018-08-28 NOTE — Progress Notes (Signed)
1:1 Note -Patient lying in bed asleep no distress noted. 1:1 continues with sitter at bedside.

## 2018-08-28 NOTE — Progress Notes (Signed)
1:1 Note - Patient is currently lying in bed asleep with sitter at bedside. Writer did arouse the patient and offered him snack and fluids which he received before taking his hs medications. 1:1 continues and patient is safe.

## 2018-08-29 DIAGNOSIS — R45850 Homicidal ideations: Secondary | ICD-10-CM

## 2018-08-29 DIAGNOSIS — R45851 Suicidal ideations: Secondary | ICD-10-CM

## 2018-08-29 NOTE — Progress Notes (Signed)
1:1 Note D: Patient observed walking laps up and down the hallway with safety attendant. He remains stoic and quiet, but is finally getting some physical activity. He continues to endorse suicidal thoughts. A: 1:1 continued for patient safety. R: Patient voices no concerns.

## 2018-08-29 NOTE — Progress Notes (Signed)
1:1 Note D: Patient was awoken to take medications. He is still drowsy, lethargic. His mood seems to have improved, and he has no obvious signs of psychosis. He is not responding to internal stimuli, and does not voice any delusions. There is no paranoia evident.  A: Patient educated on medications. 1:1 continued for patient safety. R: Patient voices no concerns.

## 2018-08-29 NOTE — Progress Notes (Signed)
1:1 Nursing note - Patient is observed lying in bed asleep with no distress noted. 1:1 continues and patient is safe.

## 2018-08-29 NOTE — Progress Notes (Signed)
Pt did not attend wrap-up group   

## 2018-08-29 NOTE — Progress Notes (Signed)
D: Patient is labile, depressed, hopeless. He feels he has no one to confide in, and he does not believe that his situation will improve. He is still having suicidal thoughts, but denies intent or plan. He is improved from yesterday, and does not appear to be actively responding to internal stimuli/hallucinations. He is still a suicide risk.  A: 1:1 continued. Patient checked q15 min, and checks reviewed. Reviewed medication changes with patient and educated on side effects. Educated patient on importance of attending group therapy sessions and educated on several coping skills. Encouarged participation in milieu through recreation therapy and attending meals with peers. Support and encouragement provided. Fluids offered. R: Patient receptive to education on medications, and is medication compliant. Patient contracts for safety on the unit.
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
## 2018-08-29 NOTE — Progress Notes (Signed)
1:1 Nursing note - late entry - Patient has been in bed asleep or resting. Writer spoke with him at beginning of shift and encouraged fluids, snacks and assessed for pain and si thoughts. He has remained in his room and writer took his hs medications to him. Writer offered snack and he declined. Writer spoke with him about possibly looking into therapy once discharged. Encouraged patient to find someone he feels comfortable to talk to. He was compliant with medications and laid back down to rest. 1:1 continues and patient is safe.

## 2018-08-29 NOTE — Progress Notes (Signed)
1:1 Nursing note- Patient is currently lying in bed asleep, no distress noted. 1:1 continues and patient is safe.

## 2018-08-29 NOTE — Progress Notes (Signed)
   08/29/18 3662  COVID-19 Daily Checkoff  Have you had a fever (temp > 37.80C/100F)  in the past 24 hours?  No  If you have had runny nose, nasal congestion, sneezing in the past 24 hours, has it worsened? No  COVID-19 EXPOSURE  Have you traveled outside the state in the past 14 days? No  Have you been in contact with someone with a confirmed diagnosis of COVID-19 or PUI in the past 14 days without wearing appropriate PPE? No  Have you been living in the same home as a person with confirmed diagnosis of COVID-19 or a PUI (household contact)? No  Have you been diagnosed with COVID-19? No

## 2018-08-30 MED ORDER — PROPRANOLOL HCL 40 MG PO TABS
40.0000 mg | ORAL_TABLET | Freq: Two times a day (BID) | ORAL | Status: DC
  Administered 2018-08-30 – 2018-08-31 (×2): 40 mg via ORAL
  Filled 2018-08-30 (×4): qty 1

## 2018-08-30 NOTE — Progress Notes (Signed)
1:1 note -  patient lying in bed as;eep no distress noted. 1:1 continues and patient is safe.

## 2018-08-30 NOTE — Progress Notes (Signed)
Patient ID: Richard [name], male   DOB: [birth year], 33 y.o.   MRN: [id number]  1:1 Nursing Progress Note  D: Patient is seen up in the milieu today and attended rec therapy. Patient then returned to his room for most of the afternoon. Per sitter, patient is still mildly paranoid and will frequently ask if his peer's voices from the hallway are directed at him. Patient has been calm and cooperative today. Environment is secured. Sitter observed with patient.  A: Patient remains on 1:1 observation per provider orders. Patient safety monitored with q15 minute safety checks.  R: Patient remains safe on the unit at this time. Will continue to monitor with 1:1 sitter, q15 min safety checks & q4 hour nursing assessments.

## 2018-08-30 NOTE — Progress Notes (Signed)
1:1 Progress Note D: Pt currently asleep in the bed. Patient appropriate to situation. Pt in no current distress.  A: Sitter is currently at bedside. R: Pt remains safe on a 1:1 per MD orders.  

## 2018-08-30 NOTE — Progress Notes (Signed)
[place] NOVEL CORONAVIRUS (COVID-19) DAILY CHECK-OFF SYMPTOMS - answer yes or no to each - every day NO YES  Have you had a fever in the past 24 hours?  . Fever (Temp > 37.80C / 100F) X   Have you had any of these symptoms in the past 24 hours? . New Cough .  Sore Throat  .  Shortness of Breath .  Difficulty Breathing .  Unexplained Body Aches   X   Have you had any one of these symptoms in the past 24 hours not related to allergies?   . Runny Nose .  Nasal Congestion .  Sneezing   X   If you have had runny nose, nasal congestion, sneezing in the past 24 hours, has it worsened?  X   EXPOSURES - check yes or no X   Have you traveled outside the state in the past 14 days?  X   Have you been in contact with someone with a confirmed diagnosis of COVID-19 or PUI in the past 14 days without wearing appropriate PPE?  X   Have you been living in the same home as a person with confirmed diagnosis of COVID-19 or a PUI (household contact)?    X   Have you been diagnosed with COVID-19?    X              What to do next: Answered NO to all: Answered YES to anything:   Proceed with unit schedule Follow the [facility] Inpatient Flowsheet.   

## 2018-08-30 NOTE — Tx Team (Signed)
Interdisciplinary Treatment and Diagnostic Plan Update  08/30/2018  Time of Session: 09:08am Richard [name] MRN: [id number]  Principal Diagnosis: <principal problem not specified>  Secondary Diagnoses: Active Problems:   MDD (major depressive disorder), severe (HCC)   Paranoid schizophrenia (HCC)   Current Medications:  Current Facility-Administered Medications  Medication Dose Route Frequency Provider Last Rate Last Dose  . benztropine (COGENTIN) tablet 1 mg  1 mg Oral BID Malvin JohnsFarah, Brian, MD   1 mg at 08/30/18 0813  . cefdinir (OMNICEF) capsule 300 mg  300 mg Oral Q12H Antonieta Pertlary, Greg Lawson, MD   300 mg at 08/30/18 0813  . diphenhydrAMINE (BENADRYL) injection 50 mg  50 mg Intramuscular Q6H PRN Antonieta Pertlary, Greg Lawson, MD   50 mg at 08/28/18 0510  . FLUoxetine (PROZAC) capsule 20 mg  20 mg Oral Daily Aldean BakerSykes, Janet E, NP   20 mg at 08/30/18 0813  . gabapentin (NEURONTIN) capsule 400 mg  400 mg Oral TID Aldean BakerSykes, Janet E, NP   400 mg at 08/30/18 0813  . haloperidol (HALDOL) tablet 15 mg  15 mg Oral TID Malvin JohnsFarah, Brian, MD   15 mg at 08/30/18 [id number]   Or  . haloperidol lactate (HALDOL) injection 5 mg  5 mg Intramuscular TID Malvin JohnsFarah, Brian, MD      . hydrOXYzine (ATARAX/VISTARIL) tablet 25 mg  25 mg Oral TID PRN Jackelyn PolingBerry, Jason A, NP   25 mg at 08/26/18 0738  . ibuprofen (ADVIL) tablet 800 mg  800 mg Oral Q6H PRN Antonieta Pertlary, Greg Lawson, MD   800 mg at 08/26/18 1539  . LORazepam (ATIVAN) tablet 2 mg  2 mg Oral Q4H PRN Antonieta Pertlary, Greg Lawson, MD   2 mg at 08/28/18 0925  . OLANZapine zydis (ZYPREXA) disintegrating tablet 5 mg  5 mg Oral Q8H PRN Aldean BakerSykes, Janet E, NP   5 mg at 08/28/18 1738  . OLANZapine zydis (ZYPREXA) disintegrating tablet 5 mg  5 mg Oral TID Aldean BakerSykes, Janet E, NP   5 mg at 08/30/18 0813  . temazepam (RESTORIL) capsule 60 mg  60 mg Oral QHS Malvin JohnsFarah, Brian, MD   60 mg at 08/29/18 2218  . traZODone (DESYREL) tablet 50 mg  50 mg Oral QHS PRN,MR X 1 Richard [name], Jason A, NP   50 mg at 08/29/18 2218   PTA  Medications: Medications Prior to Admission  Medication Sig Dispense Refill Last Dose  . acetaminophen (TYLENOL) 325 MG tablet Take 975 mg by mouth every 6 (six) hours as needed for headache (pain).     . [EXPIRED] azithromycin (ZITHROMAX) 250 MG tablet Take 1 tablet (250 mg total) by mouth daily for 1 day. 2 each 0   . cefdinir (OMNICEF) 300 MG capsule Take 1 capsule (300 mg total) by mouth every 12 (twelve) hours.     . clonazePAM (KLONOPIN) 0.5 MG tablet Take 1 tablet (0.5 mg total) by mouth at bedtime. 30 tablet 0   . clonazePAM (KLONOPIN) 0.5 MG tablet Take 1 tablet (0.5 mg total) by mouth 2 (two) times daily as needed (anxiety; agitation). 30 tablet 0   . diphenhydrAMINE (BENADRYL) 25 MG tablet Take 50-75 mg by mouth at bedtime as needed for sleep.     [name]. gabapentin (NEURONTIN) 300 MG capsule Take 1 capsule (300 mg total) by mouth 2 (two) times daily.     [name]. ibuprofen (ADVIL) 200 MG tablet Take 800 mg by mouth every 6 (six) hours as needed for headache (pain).     . traZODone (DESYREL) 100 MG tablet  Take 1 tablet (100 mg total) by mouth at bedtime.       Patient Stressors: Loss of friend by OD  Patient Strengths: General fund of knowledge Motivation for treatment/growth Physical Health Supportive family/friends  Treatment Modalities: Medication Management, Group therapy, Case management,  1 to 1 session with clinician, Psychoeducation, Recreational therapy.   Physician Treatment Plan for Primary Diagnosis: <principal problem not specified> Long Term Goal(s): Improvement in symptoms so as ready for discharge Improvement in symptoms so as ready for discharge   Short Term Goals: Ability to identify changes in lifestyle to reduce recurrence of condition will improve Ability to verbalize feelings will improve Ability to disclose and discuss suicidal ideas Ability to demonstrate self-control will improve Ability to identify and develop effective coping behaviors will improve Ability  to maintain clinical measurements within normal limits will improve Compliance with prescribed medications will improve Ability to identify triggers associated with substance abuse/mental health issues will improve Ability to identify changes in lifestyle to reduce recurrence of condition will improve Ability to verbalize feelings will improve Ability to disclose and discuss suicidal ideas Ability to demonstrate self-control will improve Ability to identify and develop effective coping behaviors will improve Ability to maintain clinical measurements within normal limits will improve Compliance with prescribed medications will improve Ability to identify triggers associated with substance abuse/mental health issues will improve  Medication Management: Evaluate patient's response, side effects, and tolerance of medication regimen.  Therapeutic Interventions: 1 to 1 sessions, Unit Group sessions and Medication administration.  Evaluation of Outcomes: Progressing  Physician Treatment Plan for Secondary Diagnosis: Active Problems:   MDD (major depressive disorder), severe (HCC)   Paranoid schizophrenia (HCC)  Long Term Goal(s): Improvement in symptoms so as ready for discharge Improvement in symptoms so as ready for discharge   Short Term Goals: Ability to identify changes in lifestyle to reduce recurrence of condition will improve Ability to verbalize feelings will improve Ability to disclose and discuss suicidal ideas Ability to demonstrate self-control will improve Ability to identify and develop effective coping behaviors will improve Ability to maintain clinical measurements within normal limits will improve Compliance with prescribed medications will improve Ability to identify triggers associated with substance abuse/mental health issues will improve Ability to identify changes in lifestyle to reduce recurrence of condition will improve Ability to verbalize feelings will  improve Ability to disclose and discuss suicidal ideas Ability to demonstrate self-control will improve Ability to identify and develop effective coping behaviors will improve Ability to maintain clinical measurements within normal limits will improve Compliance with prescribed medications will improve Ability to identify triggers associated with substance abuse/mental health issues will improve     Medication Management: Evaluate patient's response, side effects, and tolerance of medication regimen.  Therapeutic Interventions: 1 to 1 sessions, Unit Group sessions and Medication administration.  Evaluation of Outcomes: Progressing   RN Treatment Plan for Primary Diagnosis: <principal problem not specified> Long Term Goal(s): Knowledge of disease and therapeutic regimen to maintain health will improve  Short Term Goals: Ability to participate in decision making will improve, Ability to verbalize feelings will improve, Ability to disclose and discuss suicidal ideas, Ability to identify and develop effective coping behaviors will improve and Compliance with prescribed medications will improve  Medication Management: RN will administer medications as ordered by provider, will assess and evaluate patient's response and provide education to patient for prescribed medication. RN will report any adverse and/or side effects to prescribing provider.  Therapeutic Interventions: 1 on 1 counseling sessions, Psychoeducation, Medication  administration, Evaluate responses to treatment, Monitor vital signs and CBGs as ordered, Perform/monitor CIWA, COWS, AIMS and Fall Risk screenings as ordered, Perform wound care treatments as ordered.  Evaluation of Outcomes: Progressing   LCSW Treatment Plan for Primary Diagnosis: <principal problem not specified> Long Term Goal(s): Safe transition to appropriate next level of care at discharge, Engage patient in therapeutic group addressing interpersonal  concerns.  Short Term Goals: Engage patient in aftercare planning with referrals and resources and Increase skills for wellness and recovery  Therapeutic Interventions: Assess for all discharge needs, 1 to 1 time with Social worker, Explore available resources and support systems, Assess for adequacy in community support network, Educate family and significant other(s) on suicide prevention, Complete Psychosocial Assessment, Interpersonal group therapy.  Evaluation of Outcomes: Progressing   Progress in Treatment: Attending groups: No. Participating in groups: No. Taking medication as prescribed: Yes. Toleration medication: Yes. Family/Significant other contact made: No, will contact:  will contact if given consent to contact Patient understands diagnosis: No. Discussing patient identified problems/goals with staff: Yes. Medical problems stabilized or resolved: Yes. Denies suicidal/homicidal ideation: Yes. Issues/concerns per patient self-inventory: No. Other:   New problem(s) identified: No, Describe:  None  New Short Term/Long Term Goal(s): Medication stabilization, elimination of SI thoughts, and development of a comprehensive mental wellness plan.   Patient Goals:    Discharge Plan or Barriers: CSW will continue to follow up for appropriate referrals and possible discharge planning  Reason for Continuation of Hospitalization: Aggression Depression Medication stabilization  Estimated Length of Stay: 2-3 days  Attendees: Patient: 08/30/2018   Physician: Dr. Johnn Hai, MD 08/30/2018   Nursing: Estill Bamberg, RN 08/30/2018  RN Care Manager: 08/30/2018   Social Worker: Ardelle Anton, LCSW 08/30/2018  Recreational Therapist:  08/30/2018   Other:  08/30/2018   Other:  08/30/2018   Other: 08/30/2018        Scribe for Treatment Team: Trecia Rogers, LCSW 08/30/2018 10:03 AM

## 2018-08-30 NOTE — Progress Notes (Signed)
1:1 Nursing note - Patient is currently lying in bed asleep no distress noted. 1:1 continues and patient is safe with sitter at bedside.

## 2018-08-30 NOTE — Progress Notes (Signed)
Patient ID: LEXTON HIDALGO, male   DOB: Feb 15, 1985, 33 y.o.   MRN: [id number]  1:1 Nursing Progress Note  D: Patient is observed resting in bed in no distress. Patient awake for medications and dinner. Environment is secured. Sitter observed with patient.  A: Patient remains on 1:1 observation per provider orders. Patient safety monitored with q15 minute safety checks.  R: Patient remains safe on the unit at this time. Will continue to monitor with 1:1 sitter, q15 min safety checks & q4 hour nursing assessments.

## 2018-08-30 NOTE — Progress Notes (Signed)
[facility]  08/30/2018 1:19 PM Richard ElseCarl G [name]  MRN:  [id number] Subjective:   Patient finally able to give me a better interview and discuss his past work history and his recent stressors and his recent decline in functioning.  He had been so violent and has self-destructive tendencies here that he is required sedation with Haldol, IM medications and required one-to-one precautions will be not had an opportunity to delve into the acute stressors that led to this illness.  Finally he is able to participate better and we think he may be ready to go outside with the group. At this point states he still has suicidal thoughts but he can contract for safety but we will keep him on one-to-one precautions now. 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 No pertinent past medical history. History reviewed. No pertinent surgical history. Family History: History reviewed. No pertinent family history. Family Psychiatric  History: see eval Social History:  Social History   Substance and Sexual Activity  Alcohol Use  . Frequency: Never     Social History   Substance and Sexual Activity  Drug Use Not on file    Social History   Socioeconomic History  . Marital status: Single    Spouse name: Not on file  . Number of children: Not on file  . Years of education: Not on file  . Highest education level: Not on file  Occupational History  . Not on file  Social Needs  . Financial resource strain: Not on file  . Food insecurity    Worry: Not on file    Inability: Not on file  . Transportation needs    Medical: Not on file    Non-medical: Not on file   Tobacco Use  . Smoking status: Never Smoker  . Smokeless tobacco: Never Used  Substance and Sexual Activity  . Alcohol Use    Frequency: Never  . Drug use: Not on file  . Sexual activity: Yes  Lifestyle  . Physical activity    Days per week: Not on file    Minutes per session: Not on file  . Stress: Not on file  Relationships  . Social Musicianconnections    Talks on phone: Not on file    Gets together: Not on file    Attends religious service: Not on file    Active member of club or organization: Not on file    Attends meetings of clubs or organizations: Not on file    Relationship status: Not on file  Other Topics Concern  . Not on file  Social History Narrative  . Not on file   Additional Social History:                         Sleep: Fair  Appetite:  Fair  Current Medications: Current Facility-Administered Medications  Medication Dose Route Frequency Provider Last Rate Last Dose  . benztropine (COGENTIN) tablet 1 mg  1 mg Oral BID Malvin JohnsFarah, Mylan Schwarz, MD   1 mg at 08/30/18 0813  . cefdinir (OMNICEF) capsule 300 mg  300 mg Oral Q12H Antonieta Pertlary, Greg Lawson, MD   300 mg at 08/30/18 0813  .  diphenhydrAMINE (BENADRYL) injection 50 mg  50 mg Intramuscular Q6H PRN Antonieta Pertlary, Greg Lawson, MD   50 mg at 08/28/18 0510  . FLUoxetine (PROZAC) capsule 20 mg  20 mg Oral Daily Aldean BakerSykes, Janet E, NP   20 mg at 08/30/18 0813  . gabapentin (NEURONTIN) capsule 400 mg  400 mg Oral TID Aldean BakerSykes, Janet E, NP   400 mg at 08/30/18 1154  . haloperidol (HALDOL) tablet 15 mg  15 mg Oral TID Malvin JohnsFarah, Khallid Pasillas, MD   15 mg at 08/30/18 1154   Or  . haloperidol lactate (HALDOL) injection 5 mg  5 mg Intramuscular TID Malvin JohnsFarah, Johnsie Moscoso, MD      . hydrOXYzine (ATARAX/VISTARIL) tablet 25 mg  25 mg Oral TID PRN Jackelyn PolingBerry, Jason A, NP   25 mg at 08/26/18 0738  . ibuprofen (ADVIL) tablet 800 mg  800 mg Oral Q6H PRN Antonieta Pertlary, Greg Lawson, MD   800 mg at 08/26/18 1539  . LORazepam (ATIVAN) tablet 2 mg  2 mg Oral Q4H PRN Antonieta Pertlary, Greg Lawson,  MD   2 mg at 08/28/18 0925  . OLANZapine zydis (ZYPREXA) disintegrating tablet 5 mg  5 mg Oral Q8H PRN Aldean BakerSykes, Janet E, NP   5 mg at 08/28/18 1738  . OLANZapine zydis (ZYPREXA) disintegrating tablet 5 mg  5 mg Oral TID Aldean BakerSykes, Janet E, NP   5 mg at 08/30/18 1155  . propranolol (INDERAL) tablet 40 mg  40 mg Oral BID Malvin JohnsFarah, Siaosi Alter, MD      . temazepam (RESTORIL) capsule 60 mg  60 mg Oral QHS Malvin JohnsFarah, Teisha Trowbridge, MD   60 mg at 08/29/18 2218  . traZODone (DESYREL) tablet 50 mg  50 mg Oral QHS PRN,MR X 1 Nira ConnBerry, Jason A, NP   50 mg at 08/29/18 2218    Lab Results: No results found for this or any previous visit (from the past 48 hour(s)).  Blood Alcohol level:  Lab Results  Component Value Date   ETH <10 08/20/2018    Metabolic Disorder Labs: No results found for: HGBA1C, MPG No results found for: PROLACTIN No results found for: CHOL, TRIG, HDL, CHOLHDL, VLDL, LDLCALC  Physical Findings: AIMS: Facial and Oral Movements Muscles of Facial Expression: None, normal Lips and Perioral Area: None, normal Jaw: None, normal Tongue: None, normal,Extremity Movements Upper (arms, wrists, hands, fingers): None, normal Lower (legs, knees, ankles, toes): None, normal, Trunk Movements Neck, shoulders, hips: None, normal, Overall Severity Severity of abnormal movements (highest score from questions above): None, normal Incapacitation due to abnormal movements: None, normal Patient's awareness of abnormal movements (rate only patient's report): No Awareness, Dental Status Current problems with teeth and/or dentures?: No Does patient usually wear dentures?: No  CIWA:  CIWA-Ar Total: 0 COWS:     Musculoskeletal: Strength & Muscle Tone: within normal limits Gait & Station: normal Patient leans: N/A  Psychiatric Specialty Exam: Physical Exam  ROS  Blood pressure (!) 135/97, pulse (!) 101, temperature 98.2 F (36.8 C), temperature source Oral, resp. rate 18, height 6\' 4"  (1.93 m), weight 93 kg, SpO2 95  %.Body mass index is 24.95 kg/m.  General Appearance: Disheveled  Eye Contact:  Fair  Speech:  Slurred  Volume:  Decreased  Mood:  Dysphoric and Hopeless  Affect:  Depressed and Flat  Thought Process:  Linear and Descriptions of Associations: Circumstantial  Orientation:  Full (Time, Place, and Person)  Thought Content:  Logical  Suicidal Thoughts:  Yes.  without intent/plan  Homicidal Thoughts:  No  Memory:  Immediate;  Fair  Judgement:  Fair  Insight:  Fair  Psychomotor Activity:  Normal  Concentration:  Concentration: Fair  Recall:  [employer] of Knowledge:  Fair  Language:  Fair  Akathisia:  Negative  Handed:  Right  AIMS (if indicated):     Assets:  Physical Health Resilience  ADL's:  Intact  Cognition:  WNL  Sleep:  Number of Hours: 5.75     Treatment Plan Summary: Daily contact with patient to assess and evaluate symptoms and progress in treatment and Medication management continue current cognitive therapy and rehab based therapies and monitoring.  Continue med adjustments but finally showing some progress but stay on one-to-one for now  [facility], MD 08/30/2018, 1:19 PM

## 2018-08-30 NOTE — Progress Notes (Addendum)
Patient ID: Richard [name], male   DOB: [birth year], 33 y.o.   MRN: [id number]  1:1 Nursing Progress Note  D:  Patient is calm and cooperative this morning and while still flat/sad/sullen will answer nursing questions. Patient reports his voices are decreasing and he is not having active SI thoughts this morning. Patient observed up in the shower and is compliant with scheduled medications. Environment is secured. Sitter observed with patient per [employer].  A: Patient remains on 1:1 observation per provider orders. Patient safety monitored with q15 minute safety checks.  R: Patient remains safe on the unit at this time. Will continue to monitor with 1:1 sitter, q15 min safety checks & q4 hour nursing assessments.   Patient's self-inventory sheet Rated Energy Level  Low  Rated Sleep  Poor  Rated Appetite  Poor  Rated Anxiety (0-10)  10  Rated Hopelessness (0-10)  10  Rated Depression (0-10)  10  Daily Goal  "I feel drowsy but still can't sleep, I think everyone hates me"  Any Additional Comments:  N/A

## 2018-08-30 NOTE — Progress Notes (Signed)
Recreation Therapy Notes  Date: 7.27.20 Time: 1000 Location: [address] Dayroom  Group Topic: Wellness  Goal Area(s) Addresses:  Patient will define components of whole wellness. Patient will verbalize benefit of whole wellness.  Intervention: Exercise  Activity: Wellness.  LRT led group in a series of stretches.  Each patient led the group in exercises of their choosing.  Group was to complete at least 30 minutes of exercise.  Patients were allowed to take water breaks and rest breaks as needed.  Education: Wellness, Dentist.   Education Outcome: Acknowledges education/In group clarification offered/Needs additional education.   Clinical Observations/Feedback: Pt did not attend group session.     Victorino Sparrow, LRT/CTRS    Ria Comment, Jacobey Gura A 08/30/2018 11:15 AM

## 2018-08-31 MED ORDER — TEMAZEPAM 15 MG PO CAPS
45.0000 mg | ORAL_CAPSULE | Freq: Every day | ORAL | Status: DC
  Administered 2018-08-31: 45 mg via ORAL
  Filled 2018-08-31: qty 6

## 2018-08-31 MED ORDER — PROPRANOLOL HCL 80 MG PO TABS
80.0000 mg | ORAL_TABLET | Freq: Two times a day (BID) | ORAL | Status: DC
  Administered 2018-08-31 – 2018-09-01 (×2): 80 mg via ORAL
  Filled 2018-08-31 (×4): qty 1

## 2018-08-31 MED ORDER — GABAPENTIN 300 MG PO CAPS
300.0000 mg | ORAL_CAPSULE | Freq: Three times a day (TID) | ORAL | Status: DC
  Administered 2018-08-31 – 2018-09-01 (×2): 300 mg via ORAL
  Filled 2018-08-31 (×6): qty 1

## 2018-08-31 MED ORDER — PRENATAL MULTIVITAMIN CH
1.0000 | ORAL_TABLET | Freq: Every day | ORAL | Status: DC
  Administered 2018-08-31 – 2018-09-01 (×2): 1 via ORAL
  Filled 2018-08-31 (×3): qty 1

## 2018-08-31 NOTE — Progress Notes (Signed)
Patient did not attend wrap up group. 

## 2018-08-31 NOTE — Progress Notes (Signed)
1:1 Progress Note D: Pt currently asleep in the bed. Patient appropriate to situation. Pt in no current distress.  A: Sitter is currently at bedside. R: Pt remains safe on a 1:1 per MD orders.  

## 2018-08-31 NOTE — Progress Notes (Signed)
Patient ID: Richard [name], male   DOB: 12-Jul-1985, 33 y.o.   MRN: [id number]  1:1 Nursing Progress Note  D: Patient has slept through lunch; afternoon medications held. Respirations are even and unlabored. Environment is secured. Sitter observed with patient.  A: Patient remains on 1:1 observation per provider orders. Patient safety monitored with q15 minute safety checks.  R: Patient remains safe on the unit at this time. Will continue to monitor with 1:1 sitter, q15 min safety checks & q4 hour nursing assessments.

## 2018-08-31 NOTE — Progress Notes (Addendum)
Patient ID: Richard [name], male   DOB: January 08, 1986, 33 y.o.   MRN: [id number]  1:1 Nursing Progress Note  D: Patient is calm and cooperative this evening. Patient received evening medications without incident and went to the cafeteria for dinner. Environment is secured. Sitter observed with patient.  A: Patient remains on 1:1 observation per provider orders. Patient safety monitored with q15 minute safety checks.  R: Patient remains safe on the unit at this time. Will continue to monitor with 1:1 sitter, q15 min safety checks & q4 hour nursing assessments.

## 2018-08-31 NOTE — Progress Notes (Signed)
Patient ID: Richard [name], male   DOB: Oct 29, 1985, 33 y.o.   MRN: [id number]  1:1 Nursing Progress Note  D: Patient is seen up this morning and is able to engage/converse with staff. Patient states he feels sedated by his medications and adjustments were made. Patient reports he does not hear voices at this time and is not currently suicidal but does still appear distracted by his peers. Patient is observed fixating on peers who are loud in the milieu but reports he is trying to control himself. Patient states he wants to be better and be like "my old, happy go lucky self". Environment is secured. Sitter observed with patient.  A: Patient remains on 1:1 observation per provider orders. Patient safety monitored with q15 minute safety checks.  R: Patient remains safe on the unit at this time. Will continue to monitor with 1:1 sitter, q15 min safety checks & q4 hour nursing assessments.

## 2018-08-31 NOTE — Progress Notes (Signed)
Patient ID: Richard [name], male   DOB: [birth year], 33 y.o.   MRN: [id number]  Patient's self-inventory sheet Rated Energy Level  Low  Rated Sleep  Good  Rated Appetite  Fair  Rated Anxiety (0-10)  10 (miss my family)  Rated Hopelessness (0-10)  6  Rated Depression (0-10)  5  Daily Goal  "getting back to myself so I can be reunited with my family, pray and trust the doctor's opinion"  Any Additional Comments:  "I can't allow the outside voices to think they are talking to me or bringing me harm"

## 2018-08-31 NOTE — Progress Notes (Signed)
Recreation Therapy Notes  Date: 7.28.20 Time: 1000 Location: 500 [address] Dayroom  Group Topic: Triggers  Goal Area(s) Addresses:  Patient will identify three biggest triggers.   Patient will identify how to avoid triggers. Patient will identify how to face triggers head on.  Intervention: Worksheet  Activity: Triggers.  Patients were to identify what triggers certain behaviors in them.  Patients would then identify how they can avoid dealing with their triggers.  Patient would also explain how they could face triggers head on when they can't be avoided.  Education: Triggers, Discharge Planning   Education Outcome: Acknowledges education/In group clarification offered/Needs additional education.   Clinical Observations/Feedback: Pt did not attend group.     Darrel Gloss, LRT/CTRS         Braya Habermehl A 08/31/2018 11:10 AM 

## 2018-08-31 NOTE — Progress Notes (Signed)
[facility]  08/31/2018 8:58 AM Coral ElseCarl G Milner  MRN:  [id number] Subjective:    Patient finally more conversant and showing some improvement he does not have thoughts of harming himself, appreciated getting out, allow me to speak with mother and girlfriend today as well.  Did benefit from cognitive therapy is told not to ruminate about various issues that we discussed.  No thoughts of harming self as mentioned, no acute psychosis.  Probable discharge in 24 to 48 hours if continues to improve we will discontinue Haldol  Principal Problem: MDD Diagnosis: Active Problems:   MDD (major depressive disorder), severe (HCC)   Paranoid schizophrenia (HCC)  Total Time spent with patient: 20 minutes  Past Psychiatric History: neg  Past Medical History: History reviewed. No pertinent past medical history. History reviewed. No pertinent surgical history. Family History: History reviewed. No pertinent family history. Family Psychiatric  History: neg Social History:  Social History   Substance and Sexual Activity  Alcohol Use  . Frequency: Never     Social History   Substance and Sexual Activity  Drug Use Not on file    Social History   Socioeconomic History  . Marital status: Single    Spouse name: Not on file  . Number of children: Not on file  . Years of education: Not on file  . Highest education level: Not on file  Occupational History  . Not on file  Social Needs  . Financial resource strain: Not on file  . Food insecurity    Worry: Not on file    Inability: Not on file  . Transportation needs    Medical: Not on file    Non-medical: Not on file  Tobacco Use  . Smoking status: Never Smoker  . Smokeless tobacco: Never Used  Substance and Sexual Activity  . Alcohol Use    Frequency: Never  . Drug use: Not on file  . Sexual activity: Yes  Lifestyle  . Physical activity    Days per week: Not on file    Minutes per session: Not on file  . Stress: Not on file   Relationships  . Social Musicianconnections    Talks on phone: Not on file    Gets together: Not on file    Attends religious service: Not on file    Active member of club or organization: Not on file    Attends meetings of clubs or organizations: Not on file    Relationship status: Not on file  Other Topics Concern  . Not on file  Social History Narrative  . Not on file   Additional Social History:                         Sleep: Good  Appetite:  Good  Current Medications: Current Facility-Administered Medications  Medication Dose Route Frequency Provider Last Rate Last Dose  . cefdinir (OMNICEF) capsule 300 mg  300 mg Oral Q12H Antonieta Pertlary, Greg Lawson, MD   300 mg at 08/31/18 0806  . diphenhydrAMINE (BENADRYL) injection 50 mg  50 mg Intramuscular Q6H PRN Antonieta Pertlary, Greg Lawson, MD   50 mg at 08/28/18 0510  . FLUoxetine (PROZAC) capsule 20 mg  20 mg Oral Daily Aldean BakerSykes, Janet E, NP   20 mg at 08/31/18 [id number]  . gabapentin (NEURONTIN) capsule 300 mg  300 mg Oral TID Malvin JohnsFarah, Keirra Zeimet, MD      . haloperidol lactate (HALDOL) injection 5 mg  5 mg Intramuscular TID  Malvin JohnsFarah, Shauniece Kwan, MD      . hydrOXYzine (ATARAX/VISTARIL) tablet 25 mg  25 mg Oral TID PRN Jackelyn PolingBerry, Jason A, NP   25 mg at 08/26/18 0738  . ibuprofen (ADVIL) tablet 800 mg  800 mg Oral Q6H PRN Antonieta Pertlary, Greg Lawson, MD   800 mg at 08/26/18 1539  . LORazepam (ATIVAN) tablet 2 mg  2 mg Oral Q4H PRN Antonieta Pertlary, Greg Lawson, MD   2 mg at 08/30/18 2104  . OLANZapine zydis (ZYPREXA) disintegrating tablet 5 mg  5 mg Oral Q8H PRN Aldean BakerSykes, Janet E, NP   5 mg at 08/28/18 1738  . OLANZapine zydis (ZYPREXA) disintegrating tablet 5 mg  5 mg Oral TID Aldean BakerSykes, Janet E, NP   5 mg at 08/31/18 0807  . prenatal vitamin w/FE, FA (NATACHEW) chewable tablet 1 tablet  1 tablet Oral Q1200 Malvin JohnsFarah, Allin Frix, MD      . propranolol (INDERAL) tablet 80 mg  80 mg Oral BID Malvin JohnsFarah, Aubrii Sharpless, MD      . temazepam (RESTORIL) capsule 45 mg  45 mg Oral QHS Malvin JohnsFarah, Daneya Hartgrove, MD      . traZODone (DESYREL)  tablet 50 mg  50 mg Oral QHS PRN,MR X 1 Nira ConnBerry, Jason A, NP   50 mg at 08/30/18 2104    Lab Results: No results found for this or any previous visit (from the past 48 hour(s)).  Blood Alcohol level:  Lab Results  Component Value Date   ETH <10 08/20/2018    Metabolic Disorder Labs: No results found for: HGBA1C, MPG No results found for: PROLACTIN No results found for: CHOL, TRIG, HDL, CHOLHDL, VLDL, LDLCALC  Physical Findings: AIMS: Facial and Oral Movements Muscles of Facial Expression: None, normal Lips and Perioral Area: None, normal Jaw: None, normal Tongue: None, normal,Extremity Movements Upper (arms, wrists, hands, fingers): None, normal Lower (legs, knees, ankles, toes): None, normal, Trunk Movements Neck, shoulders, hips: None, normal, Overall Severity Severity of abnormal movements (highest score from questions above): None, normal Incapacitation due to abnormal movements: None, normal Patient's awareness of abnormal movements (rate only patient's report): No Awareness, Dental Status Current problems with teeth and/or dentures?: No Does patient usually wear dentures?: No  CIWA:  CIWA-Ar Total: 0 COWS:     Musculoskeletal: Strength & Muscle Tone: within normal limits Gait & Station: normal Patient leans: N/A  Psychiatric Specialty Exam: Physical Exam  ROS  Blood pressure (!) 135/97, pulse (!) 101, temperature 98.2 F (36.8 C), temperature source Oral, resp. rate 18, height 6\' 4"  (1.93 m), weight 93 kg, SpO2 95 %.Body mass index is 24.95 kg/m.  General Appearance: Casual  Eye Contact:  Good  Speech:  Clear and Coherent and Slow  Volume:  Decreased  Mood:  Dysphoric  Affect:  Congruent and Constricted  Thought Process:  Coherent and Descriptions of Associations: Tangential  Orientation:  Full (Time, Place, and Person)  Thought Content:  Rumination and Tangential  Suicidal Thoughts:  No  Homicidal Thoughts:  No  Memory:  Immediate;   Good  Judgement:  Good   Insight:  Good  Psychomotor Activity:  Normal  Concentration:  Concentration: Good  Recall:  Good  Fund of Knowledge:  Fair  Language:  Good  Akathisia:  Negative  Handed:  Right  AIMS (if indicated):     Assets:  Physical Health Resilience Social Support  ADL's:  Intact  Cognition:  WNL  Sleep:  Number of Hours: 6.75     Treatment Plan Summary: Daily contact with patient to assess  and evaluate symptoms and progress in treatment and Medication management continue but decrease Neurontin, discontinue haloperidol, not necessary for safety at this point in time, continue antidepressant augment with B vitamins probable discharge as mentioned in 1 to 2 days  Johnn Hai, MD 08/31/2018, 8:58 AM

## 2018-08-31 NOTE — [facility] Counselor (Signed)
Adult Comprehensive Assessment  Patient ID: Richard [name], male   DOB: [birth year], 33 y.o.   MRN: [id number]  Information Source: Information source: Patient  Current Stressors:  Patient states their primary concerns and needs for treatment are:: "I jumped from a 2 story balcony and landed on my back" Patient states their goals for this hospitilization and ongoing recovery are:: "To get back to my regular happy-to-go lucky self. 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 Pt reports the loss of his friend, JB.  Living/Environment/Situation:  Living Arrangements: Parent Living conditions (as described by patient or guardian): "Living conditions are perfect. House is cold" Who else lives in the home?: Mom, dad, sister, and sister's two kids How long has patient lived in current situation?: "For a while" What is atmosphere in current home: Comfortable, Quarry manager, Supportive  Family History:  Marital status: Long term relationship Long term relationship, how long?: 4-5 years What types of issues is patient dealing with in the relationship?: Pt denies stressors Are you sexually active?: Yes What is your sexual orientation?: Heterosexual Has your sexual activity been affected by drugs, alcohol, medication, or emotional stress?: No Does patient have children?: No  Childhood History:  By whom was/is the patient raised?: Both parents Description of patient's  relationship with caregiver when they were a child: "Great" Patient's description of current relationship with people who raised him/her: "Great" How were you disciplined when you got in trouble as a child/adolescent?: Spankings - appropriate Does patient have siblings?: Yes Number of Siblings: 3(1 brother and 2 sisters) Description of patient's current relationship with siblings: "good relationships with my sisters. 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 [employer] long has patient been employed?: 3 years Patient's job has been impacted by current illness: Yes Describe how patient's job has been impacted: "I can't even be there for my customers or remember the number to my shop and the other owner" What is the longest time patient has a held a job?: 1-2 years 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 If attempted suicide, did drugs/alcohol play a role in this?: No Alcohol/Substance Abuse Treatment Hx: Denies past history Has alcohol/substance abuse ever caused legal problems?: Yes[place]  Social Support System:   Patient's Community Support System: Good Describe Community Support System: Family and  friends Type of faith/religion: Ephriam KnucklesChristian How does patient's faith help to cope with current illness?: "Praying to God and giving him thanks every day I wake up"  Leisure/Recreation:   Leisure and Hobbies: Basketball, pets/animals, and IT trainercollecting T-shirts  Strengths/Needs:   What is the patient's perception of their strengths?: Finding merchandise for my store and communicating Patient states they can use these personal strengths during their treatment to contribute to their recovery: "Get back to it" Patient states these barriers may affect/interfere with their treatment: "Coping with other patients here in the hospital" Patient states these barriers may affect their return to the community: N/A Other important information patient would like considered in planning for their treatment: N/A  Discharge Plan:   Currently receiving community mental health services: No Patient states concerns and preferences for aftercare planning are: [facility] Patient states they will know when they are safe and ready for discharge when: "Once I am not as drowsy and weak from the pills" Does patient have access to transportation?: Yes(family) Does patient have financial barriers related to discharge medications?: No Will patient be returning to same living situation after discharge?: Yes(family)  Summary/Recommendations:   Summary and Recommendations (to be completed by the evaluator): Patient is a 33 year old male  with a probable past psychiatric history significant for psychotic disorder (presumably schizophrenia) who  [facility] after jumping from a two-story building with the intention of killing himself. Recommendations for pt include: crisis stabilization, therapeutic milieu, medication management, attend and participate in group therapy, and development of a comprehensive mental wellness plan.  Delphia GratesJasmine M Jacky Dross. 08/31/2018

## 2018-09-01 MED ORDER — PROPRANOLOL HCL 80 MG PO TABS: 80.0000 mg | ORAL_TABLET | Freq: Two times a day (BID) | ORAL | 2 refills | Status: AC

## 2018-09-01 MED ORDER — FLUOXETINE HCL 20 MG PO CAPS: 20.0000 mg | ORAL_CAPSULE | Freq: Every day | ORAL | 1 refills | Status: AC

## 2018-09-01 MED ORDER — ENLYTE PO CAPS: ORAL_CAPSULE | ORAL | 1 refills | Status: AC

## 2018-09-01 MED ORDER — GABAPENTIN 300 MG PO CAPS: 300.0000 mg | ORAL_CAPSULE | Freq: Three times a day (TID) | ORAL | 1 refills | Status: AC

## 2018-09-01 MED ORDER — CEFDINIR 300 MG PO CAPS: 300.0000 mg | ORAL_CAPSULE | Freq: Two times a day (BID) | ORAL | 1 refills | Status: AC

## 2018-09-01 NOTE — Plan of Care (Signed)
  Problem: Education: Goal: Knowledge of [facility] Education information/materials will improve Outcome: Adequate for Discharge Goal: Emotional status will improve Outcome: Adequate for Discharge Goal: Mental status will improve Outcome: Adequate for Discharge Goal: Verbalization of understanding the information provided will improve Outcome: Adequate for Discharge   Problem: Activity: Goal: Interest or engagement in activities will improve Outcome: Adequate for Discharge Goal: Sleeping patterns will improve Outcome: Adequate for Discharge   Problem: Coping: Goal: Ability to verbalize frustrations and anger appropriately will improve Outcome: Adequate for Discharge Goal: Ability to demonstrate self-control will improve Outcome: Adequate for Discharge   Problem: Health Behavior/Discharge Planning: Goal: Identification of resources available to assist in meeting health care needs will improve Outcome: Adequate for Discharge Goal: Compliance with treatment plan for underlying cause of condition will improve Outcome: Adequate for Discharge   Problem: Physical Regulation: Goal: Ability to maintain clinical measurements within normal limits will improve Outcome: Adequate for Discharge   Problem: Safety: Goal: Periods of time without injury will increase Outcome: Adequate for Discharge   

## 2018-09-01 NOTE — Progress Notes (Signed)
  [facility] Adult Case Management Discharge Plan :  Will you be returning to the same living situation after discharge:  Yes,  with parents and sibling At discharge, do you have transportation home?: Yes,  pt's mother Do you have the ability to pay for your medications: No.; [facility]  Release of information consent forms completed and in the chart;  Patient's signature needed at discharge.  Patient to Follow up at: Follow-up Information    [facility] Follow up on 09/03/2018.   Why: Telephonic hospital follow up appointment is Friday, 7/31 at 10:00a.  The provider will contact you.  Contact information: [address] [place] [id number] [phone]           Next level of care provider has access to [place] and Suicide Prevention discussed: Yes,  pt's mother     Has patient been referred to the Quitline?: N/A patient is not a smoker  Patient has been referred for addiction treatment: Yes  Trecia Rogers, LCSW 09/01/2018, 10:46 AM

## 2018-09-01 NOTE — Discharge Summary (Signed)
Physician Discharge Summary Note  Patient:  Richard [name] is an 33 y.o., male MRN:  [id number] DOB:  09-Feb-1985 Patient phone:  [phone] (home)  Patient address:   [address] [place] [phone],  Total Time spent with patient: 45 minutes  Date of Admission:  08/23/2018 Date of Discharge: 09/01/2018  Reason for Admission:   MDD (major depressive disorder), severe (HCC)  History of Present Illness: Patient is seen and examined. Patient is a 33 year old male with a probable past psychiatric history significant for psychotic disorder (presumably schizophrenia) who presented originally to the [facility] emergency department on 08/20/2018 after jumping from a two-story building with the intention of killing himself.The patient is currently psychotic, and ended up significantly agitated. Most of the history is collected from review of the electronic medical record. The patient was noted to have an ammonia with multifocal consolidations in his lungs. He had a nonspecific pneumomediastinum. He also noted to have a left pneumothorax. During the course of the hospitalization he had periods of time with significant psychosis and agitation. He received Geodon at least once. His drug screen on admission was positive for benzodiazepines, and these were apparently given by emergency medical services. He received ceftriaxone as well as azithromycin. He was seen on the consultation service on 08/21/2018. They recommended Neurontin, trazodone. He was felt to be stable for transfer on 08/23/2018. His continued medications included azithromycin and Omnicef by mouth as well as the clonazepam and Neurontin. Unfortunately upon arrival in the hospital last night he attempted to barricade himself in a room, and had to be moved to the 500 hall. This morning he is significantly paranoid, agitated and threatening. He required intramuscular PRN medications. He does appear to be psychotic. We have  no previous psychiatric records with regard to his past history. Unfortunately whoever admitted him last night also did not write for his antibiotics.  Principal Problem: Suicidality Discharge Diagnoses: Active Problems:   MDD (major depressive disorder), severe (HCC)   Paranoid schizophrenia (HCC)   Past Psychiatric History: Negative  past Medical History: History reviewed. No pertinent past medical history. History reviewed. No pertinent surgical history. Family History: History reviewed. No pertinent family history. Family Psychiatric  History: Noncontributory Social History:  Social History   Substance and Sexual Activity  Alcohol Use  . Frequency: Never     Social History   Substance and Sexual Activity  Drug Use Not on file    Social History   Socioeconomic History  . Marital status: Single    Spouse name: Not on file  . Number of children: Not on file  . Years of education: Not on file  . Highest education level: Not on file  Occupational History  . Not on file  Social Needs  . Financial resource strain: Not on file  . Food insecurity    Worry: Not on file    Inability: Not on file  . Transportation needs    Medical: Not on file    Non-medical: Not on file  Tobacco Use  . Smoking status: Never Smoker  . Smokeless tobacco: Never Used  Substance and Sexual Activity  . Alcohol Use    Frequency: Never  . Drug use: Not on file  . Sexual activity: Yes  Lifestyle  . Physical activity    Days per week: Not on file    Minutes per session: Not on file  . Stress: Not on file  Relationships  . Social connections    Talks  on phone: Not on file    Gets together: Not on file    Attends religious service: Not on file    Active member of club or organization: Not on file    Attends meetings of clubs or organizations: Not on file    Relationship status: Not on file  Other Topics Concern  . Not on file  Social History Narrative  . Not on file    Hospital  Course:    As discussed patient had such intense self-destructive desire and activities that he required one-to-one precautions early in his stay he required physical and chemical restraint as he was stuffing sheets in his mouth trying to bang his head so forth, this level of self-destructive notes and the intensity with which he was violent towards himself indicated a probable psychosis in the context of his depression although we did not find evidence for schizophreniform condition. We actually use Haldol mainly to keep him from harming himself and to over sedate him for period of time until we could reason with him and he became more rational By the date of the 28th I felt we could finally do some cognitive therapy and he was no longer having suicidal thoughts so we discontinued the Haldol. It appeared a conflation of stressors that led to this extreme reaction that again seem to be severe depression with psychosis rather than a first break psychosis. By the date of the 29th he was still on one-to-one precautions given the trauma of his recent activities but we were able to discontinue this and after speaking with him and his family I think he stable to go home at this point in time he is alert oriented cooperative affect appropriate speech normal in tone, no thoughts of harming self contracting fully again fully coherent without psychosis or self-destructive thoughts or behaviors.  Physical Findings: AIMS: Facial and Oral Movements Muscles of Facial Expression: None, normal Lips and Perioral Area: None, normal Jaw: None, normal Tongue: None, normal,Extremity Movements Upper (arms, wrists, hands, fingers): None, normal Lower (legs, knees, ankles, toes): None, normal, Trunk Movements Neck, shoulders, hips: None, normal, Overall Severity Severity of abnormal movements (highest score from questions above): None, normal Incapacitation due to abnormal movements: None, normal Patient's awareness of  abnormal movements (rate only patient's report): No Awareness, Dental Status Current problems with teeth and/or dentures?: No Does patient usually wear dentures?: No  CIWA:  CIWA-Ar Total: 0 COWS:     Musculoskeletal: Strength & Muscle Tone: within normal limits Gait & Station: normal Patient leans: N/A  Psychiatric Specialty Exam: ROS  Blood pressure 121/74, pulse 82, temperature 98.2 F (36.8 C), temperature source Oral, resp. rate 18, height 6\' 4"  (1.93 m), weight 93 kg, SpO2 95 %.Body mass index is 24.95 kg/m.  General Appearance: Casual  Eye Contact::  Good  Speech:  Clear and Coherent409  Volume:  Normal  Mood:  Dysphoric  Affect:  Constricted  Thought Process:  Coherent and Descriptions of Associations: Intact  Orientation:  Full (Time, Place, and Person)  Thought Content:  Logical  Suicidal Thoughts:  No  Homicidal Thoughts:  No  Memory:  Immediate;   Good  Judgement:  Good  Insight:  Good  Psychomotor Activity:  Normal  Concentration:  Good  Recall:  Good  Fund of Knowledge:Fair  Language: Good  Akathisia:  Negative  Handed:  Right  AIMS (if indicated):     Assets:  Physical Health  Sleep:  Number of Hours: 6.5  Cognition: WNL  ADL's:  Intact       Has this patient used any form of tobacco in the last 30 days? (Cigarettes, Smokeless Tobacco, Cigars, and/or Pipes) Yes, No  Blood Alcohol level:  Lab Results  Component Value Date   ETH <10 63/02/6008    Metabolic Disorder Labs:  No results found for: HGBA1C, MPG No results found for: PROLACTIN No results found for: CHOL, TRIG, HDL, CHOLHDL, VLDL, LDLCALC  See Psychiatric Specialty Exam and Suicide Risk Assessment completed by Attending Physician prior to discharge.  Discharge destination:  Home  Is patient on multiple antipsychotic therapies at discharge:  No   Has Patient had three or more failed trials of antipsychotic monotherapy by history:  No  Recommended Plan for Multiple Antipsychotic  Therapies: NA   Allergies as of 09/01/2018   No Known Allergies     Medication List    STOP taking these medications   acetaminophen 325 MG tablet Commonly known as: TYLENOL   azithromycin 250 MG tablet Commonly known as: ZITHROMAX   clonazePAM 0.5 MG tablet Commonly known as: KLONOPIN   diphenhydrAMINE 25 MG tablet Commonly known as: BENADRYL   traZODone 100 MG tablet Commonly known as: DESYREL     TAKE these medications     Indication  cefdinir 300 MG capsule Commonly known as: OMNICEF Take 1 capsule (300 mg total) by mouth every 12 (twelve) hours.  Indication: Community Acquired Pneumonia   EnLyte Caps 1 a day If not covered go to [email or web address]  Indication: 21-Hydroxylase Deficiency   FLUoxetine 20 MG capsule Commonly known as: PROZAC Take 1 capsule (20 mg total) by mouth daily.  Indication: Depression   gabapentin 300 MG capsule Commonly known as: NEURONTIN Take 1 capsule (300 mg total) by mouth 3 (three) times daily. What changed: when to take this  Indication: Fibromyalgia Syndrome   ibuprofen 200 MG tablet Commonly known as: ADVIL Take 800 mg by mouth every 6 (six) hours as needed for headache (pain).  Indication: Fever, Headache   propranolol 80 MG tablet Commonly known as: INDERAL Take 1 tablet (80 mg total) by mouth 2 (two) times daily.  Indication: Feeling Anxious, High Blood Pressure Disorder      Follow-up Information    [facility] Follow up.   Why: New pt Contact information: [address] [place] [address] [phone]          SignedJohnn Hai, MD 09/01/2018, 7:53 AM

## 2018-09-01 NOTE — BHH Suicide Risk Assessment (Signed)
[facility] Discharge Suicide Risk Assessment   Principal Problem: MDD w/psychosis Discharge Diagnoses: Active Problems:   MDD (major depressive disorder), severe ([place])   Paranoid schizophrenia ([place])   Total Time spent with patient: 45 minutes  Musculoskeletal: Strength & Muscle Tone: within normal limits Gait & Station: normal Patient leans: N/A  Psychiatric Specialty Exam: ROS  Blood pressure 121/74, pulse 82, temperature 98.2 F (36.8 C), temperature source Oral, resp. rate 18, height 6\' 4"  (1.93 m), weight 93 kg, SpO2 95 %.Body mass index is 24.95 kg/m.  General Appearance: Casual  Eye Contact::  Good  Speech:  Clear and Coherent409  Volume:  Normal  Mood:  Dysphoric  Affect:  Constricted  Thought Process:  Coherent and Descriptions of Associations: Intact  Orientation:  Full (Time, Place, and Person)  Thought Content:  Logical  Suicidal Thoughts:  No  Homicidal Thoughts:  No  Memory:  Immediate;   Good  Judgement:  Good  Insight:  Good  Psychomotor Activity:  Normal  Concentration:  Good  Recall:  Good  Fund of Knowledge:Fair  Language: Good  Akathisia:  Negative  Handed:  Right  AIMS (if indicated):     Assets:  Physical Health  Sleep:  Number of Hours: 6.5  Cognition: WNL  ADL's:  Intact   Mental Status Per Nursing Assessment::   On Admission:  NA  Demographic Factors:  Male and Unemployed  Loss Factors: Decrease in vocational status  Historical Factors: Impulsivity  Risk Reduction Factors:   Sense of responsibility to family, Religious beliefs about death and Employed  Continued Clinical Symptoms:  Dysthymia  Cognitive Features That Contribute To Risk:  None    Suicide Risk:  Minimal: No identifiable suicidal ideation.  Patients presenting with no risk factors but with morbid ruminations; may be classified as minimal risk based on the severity of the depressive symptoms  Follow-up Information    [facility] Follow up.   Why: New pt Contact  information: [address] [place] [address] [phone]           Plan Of Care/Follow-up recommendations:  Activity:  full  Lauri Purdum, MD 09/01/2018, 7:49 AM

## 2018-09-01 NOTE — Progress Notes (Signed)
1:1 Progress Note D: Pt currently asleep. Patient appropriate to situation. Pt in no current distress.  A: Sitter is currently sitting at bediside. R: Pt remains safe on a 1:1 per MD orders.

## 2018-09-01 NOTE — Progress Notes (Signed)
1:1 Progress Note D: Pt currently asleep. Patient appropriate to situation. Pt in no current distress.  A: Sitter is currently sitting at bediside. R: Pt remains safe on a 1:1 per MD orders.  

## 2018-09-01 NOTE — Progress Notes (Signed)
   09/01/18 0913  COVID-19 Daily Checkoff  Have you had a fever (temp > 37.80C/100F)  in the past 24 hours?  No  If you have had runny nose, nasal congestion, sneezing in the past 24 hours, has it worsened? No  COVID-19 EXPOSURE  Have you traveled outside the state in the past 14 days? No  Have you been in contact with someone with a confirmed diagnosis of COVID-19 or PUI in the past 14 days without wearing appropriate PPE? No  Have you been living in the same home as a person with confirmed diagnosis of COVID-19 or a PUI (household contact)? No  Have you been diagnosed with COVID-19? No

## 2018-09-01 NOTE — BHH Suicide Risk Assessment (Signed)
[place] INPATIENT:  Family/Significant Other Suicide Prevention Education  Suicide Prevention Education:  Education Completed; Pt's mother, Mordecai Tindol, has been identified by the patient as the family member/significant other with whom the patient will be residing, and identified as the person(s) who will aid the patient in the event of a mental health crisis (suicidal ideations/suicide attempt).  With written consent from the patient, the family member/significant other has been provided the following suicide prevention education, prior to the and/or following the discharge of the patient.  The suicide prevention education provided includes the following:  Suicide risk factors  Suicide prevention and interventions  National Suicide Hotline telephone number  [facility] assessment telephone number  [facility] Emergency Assistance [place] and/or Residential Mobile Crisis Unit telephone number  Request made of family/significant other to:  Remove weapons (e.g., guns, rifles, knives), all items previously/currently identified as safety concern.    Remove drugs/medications (over-the-counter, prescriptions, illicit drugs), all items previously/currently identified as a safety concern.  The family member/significant other verbalizes understanding of the suicide prevention education information provided.  The family member/significant other agrees to remove the items of safety concern listed above.   CSW contacted pt's mother, Gurnoor Ursua. Pt's mother stated that she does not have any concerns or questions regarding the patients care or discharge.    Trecia Rogers 09/01/2018, 10:22 AM

## 2018-09-01 NOTE — Tx Team (Signed)
Interdisciplinary Treatment and Diagnostic Plan Update  09/01/2018 Time of Session: 09:16am Coral ElseCarl G Steinhart MRN: [id number]  Principal Diagnosis: <principal problem not specified>  Secondary Diagnoses: Active Problems:   MDD (major depressive disorder), severe (HCC)   Paranoid schizophrenia (HCC)   Current Medications:  Current Facility-Administered Medications  Medication Dose Route Frequency Provider Last Rate Last Dose  . cefdinir (OMNICEF) capsule 300 mg  300 mg Oral Q12H Antonieta Pertlary, Greg Lawson, MD   300 mg at 09/01/18 0913  . diphenhydrAMINE (BENADRYL) injection 50 mg  50 mg Intramuscular Q6H PRN Antonieta Pertlary, Greg Lawson, MD   50 mg at 08/28/18 0510  . FLUoxetine (PROZAC) capsule 20 mg  20 mg Oral Daily Aldean BakerSykes, Janet E, NP   20 mg at 09/01/18 0913  . gabapentin (NEURONTIN) capsule 300 mg  300 mg Oral TID Malvin JohnsFarah, Brian, MD   300 mg at 09/01/18 0912  . hydrOXYzine (ATARAX/VISTARIL) tablet 25 mg  25 mg Oral TID PRN Jackelyn PolingBerry, Jason A, NP   25 mg at 08/26/18 0738  . ibuprofen (ADVIL) tablet 800 mg  800 mg Oral Q6H PRN Antonieta Pertlary, Greg Lawson, MD   800 mg at 08/26/18 1539  . LORazepam (ATIVAN) tablet 2 mg  2 mg Oral Q4H PRN Antonieta Pertlary, Greg Lawson, MD   2 mg at 08/31/18 2122  . OLANZapine zydis (ZYPREXA) disintegrating tablet 5 mg  5 mg Oral Q8H PRN Aldean BakerSykes, Janet E, NP   5 mg at 08/28/18 1738  . OLANZapine zydis (ZYPREXA) disintegrating tablet 5 mg  5 mg Oral TID Aldean BakerSykes, Janet E, NP   5 mg at 09/01/18 0912  . prenatal multivitamin tablet 1 tablet  1 tablet Oral Daily Malvin JohnsFarah, Brian, MD   1 tablet at 09/01/18 0913  . propranolol (INDERAL) tablet 80 mg  80 mg Oral BID Malvin JohnsFarah, Brian, MD   80 mg at 09/01/18 0913  . temazepam (RESTORIL) capsule 45 mg  45 mg Oral QHS Malvin JohnsFarah, Brian, MD   45 mg at 08/31/18 2121  . traZODone (DESYREL) tablet 50 mg  50 mg Oral QHS PRN,MR X 1 Nira ConnBerry, Jason A, NP   50 mg at 08/31/18 2122   PTA Medications: Medications Prior to Admission  Medication Sig Dispense Refill Last Dose  . acetaminophen  (TYLENOL) 325 MG tablet Take 975 mg by mouth every 6 (six) hours as needed for headache (pain).     . [EXPIRED] azithromycin (ZITHROMAX) 250 MG tablet Take 1 tablet (250 mg total) by mouth daily for 1 day. 2 each 0   . cefdinir (OMNICEF) 300 MG capsule Take 1 capsule (300 mg total) by mouth every 12 (twelve) hours.     . clonazePAM (KLONOPIN) 0.5 MG tablet Take 1 tablet (0.5 mg total) by mouth at bedtime. 30 tablet 0   . clonazePAM (KLONOPIN) 0.5 MG tablet Take 1 tablet (0.5 mg total) by mouth 2 (two) times daily as needed (anxiety; agitation). 30 tablet 0   . diphenhydrAMINE (BENADRYL) 25 MG tablet Take 50-75 mg by mouth at bedtime as needed for sleep.     [name]. gabapentin (NEURONTIN) 300 MG capsule Take 1 capsule (300 mg total) by mouth 2 (two) times daily.     [name]. ibuprofen (ADVIL) 200 MG tablet Take 800 mg by mouth every 6 (six) hours as needed for headache (pain).     . traZODone (DESYREL) 100 MG tablet Take 1 tablet (100 mg total) by mouth at bedtime.       Patient Stressors: Loss of friend by OD  Patient  Strengths: General fund of knowledge Motivation for treatment/growth Physical Health Supportive family/friends  Treatment Modalities: Medication Management, Group therapy, Case management,  1 to 1 session with clinician, Psychoeducation, Recreational therapy.   Physician Treatment Plan for Primary Diagnosis: <principal problem not specified> Long Term Goal(s): Improvement in symptoms so as ready for discharge Improvement in symptoms so as ready for discharge   Short Term Goals: Ability to identify changes in lifestyle to reduce recurrence of condition will improve Ability to verbalize feelings will improve Ability to disclose and discuss suicidal ideas Ability to demonstrate self-control will improve Ability to identify and develop effective coping behaviors will improve Ability to maintain clinical measurements within normal limits will improve Compliance with prescribed medications  will improve Ability to identify triggers associated with substance abuse/mental health issues will improve Ability to identify changes in lifestyle to reduce recurrence of condition will improve Ability to verbalize feelings will improve Ability to disclose and discuss suicidal ideas Ability to demonstrate self-control will improve Ability to identify and develop effective coping behaviors will improve Ability to maintain clinical measurements within normal limits will improve Compliance with prescribed medications will improve Ability to identify triggers associated with substance abuse/mental health issues will improve  Medication Management: Evaluate patient's response, side effects, and tolerance of medication regimen.  Therapeutic Interventions: 1 to 1 sessions, Unit Group sessions and Medication administration.  Evaluation of Outcomes: Adequate for Discharge  Physician Treatment Plan for Secondary Diagnosis: Active Problems:   MDD (major depressive disorder), severe (HCC)   Paranoid schizophrenia (HCC)  Long Term Goal(s): Improvement in symptoms so as ready for discharge Improvement in symptoms so as ready for discharge   Short Term Goals: Ability to identify changes in lifestyle to reduce recurrence of condition will improve Ability to verbalize feelings will improve Ability to disclose and discuss suicidal ideas Ability to demonstrate self-control will improve Ability to identify and develop effective coping behaviors will improve Ability to maintain clinical measurements within normal limits will improve Compliance with prescribed medications will improve Ability to identify triggers associated with substance abuse/mental health issues will improve Ability to identify changes in lifestyle to reduce recurrence of condition will improve Ability to verbalize feelings will improve Ability to disclose and discuss suicidal ideas Ability to demonstrate self-control will  improve Ability to identify and develop effective coping behaviors will improve Ability to maintain clinical measurements within normal limits will improve Compliance with prescribed medications will improve Ability to identify triggers associated with substance abuse/mental health issues will improve     Medication Management: Evaluate patient's response, side effects, and tolerance of medication regimen.  Therapeutic Interventions: 1 to 1 sessions, Unit Group sessions and Medication administration.  Evaluation of Outcomes: Adequate for Discharge   RN Treatment Plan for Primary Diagnosis: <principal problem not specified> Long Term Goal(s): Knowledge of disease and therapeutic regimen to maintain health will improve  Short Term Goals: Ability to participate in decision making will improve, Ability to verbalize feelings will improve, Ability to disclose and discuss suicidal ideas, Ability to identify and develop effective coping behaviors will improve and Compliance with prescribed medications will improve  Medication Management: RN will administer medications as ordered by provider, will assess and evaluate patient's response and provide education to patient for prescribed medication. RN will report any adverse and/or side effects to prescribing provider.  Therapeutic Interventions: 1 on 1 counseling sessions, Psychoeducation, Medication administration, Evaluate responses to treatment, Monitor vital signs and CBGs as ordered, Perform/monitor CIWA, COWS, AIMS and Fall Risk screenings as  ordered, Perform wound care treatments as ordered.  Evaluation of Outcomes: Adequate for Discharge   LCSW Treatment Plan for Primary Diagnosis: <principal problem not specified> Long Term Goal(s): Safe transition to appropriate next level of care at discharge, Engage patient in therapeutic group addressing interpersonal concerns.  Short Term Goals: Engage patient in aftercare planning with referrals and  resources and Increase skills for wellness and recovery  Therapeutic Interventions: Assess for all discharge needs, 1 to 1 time with Social worker, Explore available resources and support systems, Assess for adequacy in community support network, Educate family and significant other(s) on suicide prevention, Complete Psychosocial Assessment, Interpersonal group therapy.  Evaluation of Outcomes: Adequate for Discharge   Progress in Treatment: Attending groups: No. Participating in groups: No. Taking medication as prescribed: Yes. Toleration medication: Yes. Family/Significant other contact made: Yes, individual(s) contacted:  pt's mother Patient understands diagnosis: Yes. Discussing patient identified problems/goals with staff: Yes. Medical problems stabilized or resolved: Yes. Denies suicidal/homicidal ideation: Yes. Issues/concerns per patient self-inventory: No. Other:   New problem(s) identified: No, Describe:  None  New Short Term/Long Term Goal(s): Medication stabilization, elimination of SI thoughts, and development of a comprehensive mental wellness plan.   Patient Goals:    Discharge Plan or Barriers: Patient is discharging today. Pt will be going to [facility] for aftercare.  Reason for Continuation of Hospitalization: Patient is discharging today.   Estimated Length of Stay: Patient is discharging today.   Attendees: Patient: 09/01/2018  Physician: Dr. Johnn Hai, MD 09/01/2018   Nursing: Wille Glaser RN 09/01/2018  RN Care Manager: 09/01/2018   Social Worker: Ardelle Anton, LCSW 09/01/2018   Recreational Therapist:  09/01/2018   Other:  09/01/2018   Other:  09/01/2018   Other: 09/01/2018      Scribe for Treatment Team: Trecia Rogers, LCSW 09/01/2018 10:47 AM

## 2018-09-01 NOTE — Progress Notes (Signed)
D: Pt A & O X 3. Denies SI, HI, AVH and pain at this time. D/C home as ordered. Picked up in lobby by parents.  A: D/C instructions reviewed with pt including prescriptions and follow up appointment, compliance encouraged. All belongings from locker #16 given to pt at time of departure. Scheduled and PRN medications given with verbal education and effects monitored. Safety checks maintained without incident till time of d/c.  R: Pt receptive to care. Compliant with medications when offered. Denies adverse drug reactions when assessed. Verbalized understanding related to d/c instructions. Signed belonging sheet in agreement with items received from locker. Ambulatory with a steady gait. Appears to be in no physical distress at time of departure.

## 2018-09-01 NOTE — Progress Notes (Signed)
Recreation Therapy Notes  Date: 7.29.20 Time: 1000 Location: 500 [address] Day Room  Group Topic: Communication, Team Building, Problem Solving  Goal Area(s) Addresses:  Patient will effectively work with peer towards shared goal.  Patient will identify skill used to make activity successful.  Patient will identify how skills used during activity can be used to reach post d/c goals.   Intervention: STEM Activity   Activity: Straw Bridge.  In groups, patients were given 15 plastic straws and about 2 feet of masking tape.  Patients were to use the supplies provided to them to construct an elevated bridge that could hold a small puzzle box.  Education: Education officer, community, Dentist.   Education Outcome: Acknowledges education  Clinical Observations/Feedback:  Pt did not attend group.     Victorino Sparrow, LRT/CTRS         Ria Comment, Myrtha Tonkovich A 09/01/2018 11:16 AM

## 2020-10-16 IMAGING — CT CT CERVICAL SPINE WITHOUT CONTRAST
3 of 4 series · 13 of 33 positions shown, 16 images · non-contrast
Comparison: None.

CLINICAL DATA: Cervical spine injury, ligament injury suspected.
Suicide attempt, fall from second story balcony

EXAM:
CT HEAD WITHOUT CONTRAST
CT CERVICAL SPINE WITHOUT CONTRAST
TECHNIQUE: Multidetector CT imaging of the head and cervical spine was
performed following the standard protocol without intravenous
contrast. Multiplanar CT image reconstructions of the cervical spine
were also generated.

[Series 5: sag bone · sagittal · 0.42mm/px · 5 of 61 slices shown, 6 images]
[im 21/61  bone]
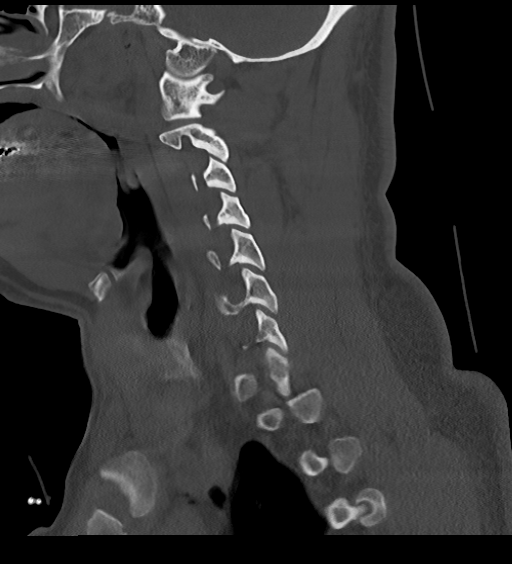
[im 26/61  bone]
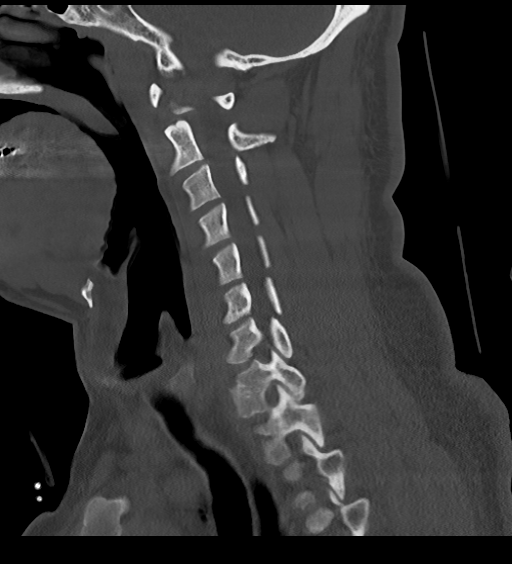
[im 31/61  soft-tissue]
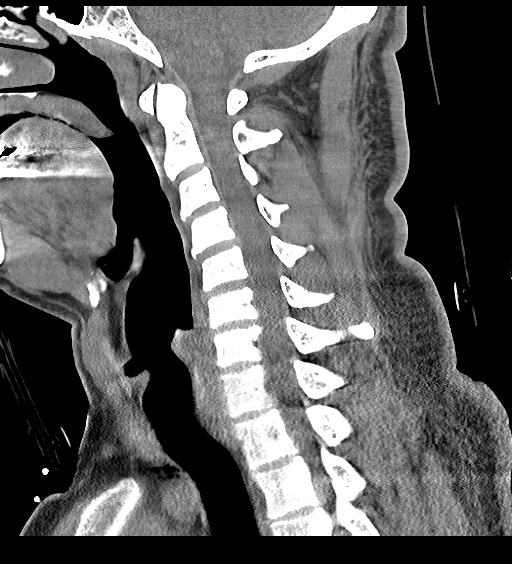
[im 31/61  bone]
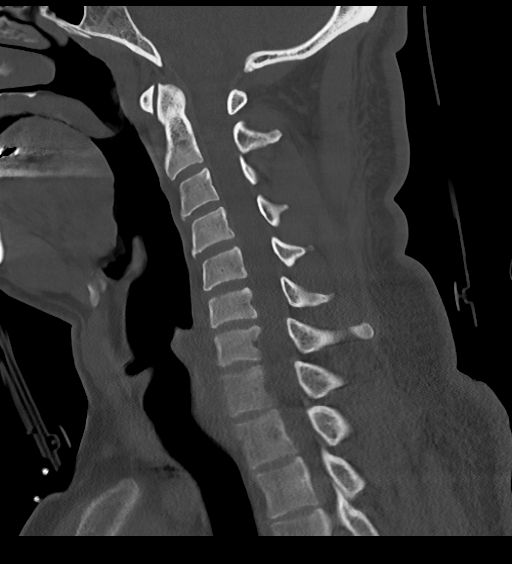
[im 36/61  bone]
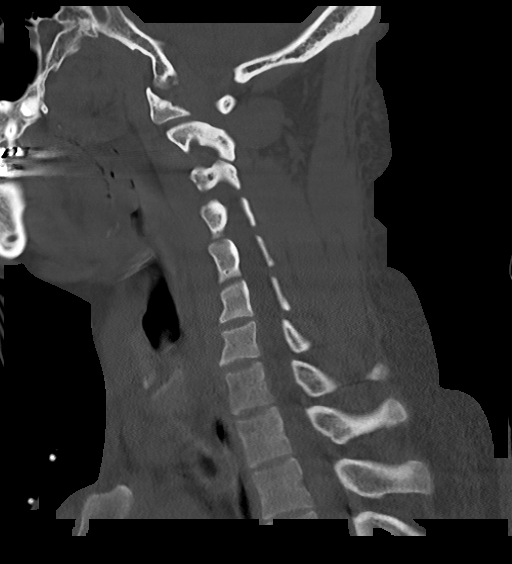
[im 41/61  bone]
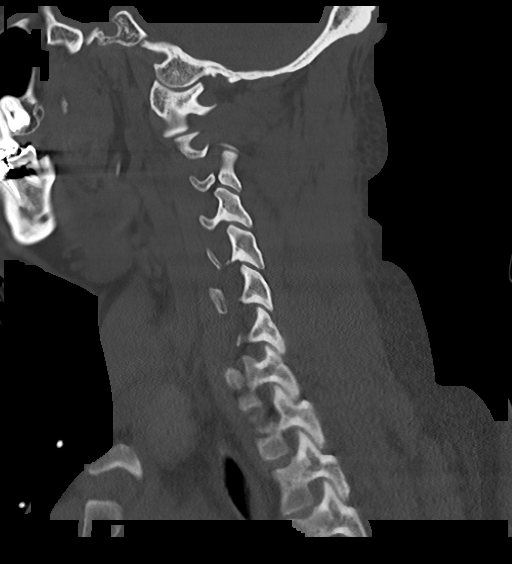

[Series 6: cor bone · coronal · 0.33mm/px · 3 of 69 slices shown]
[im 14/69  bone]
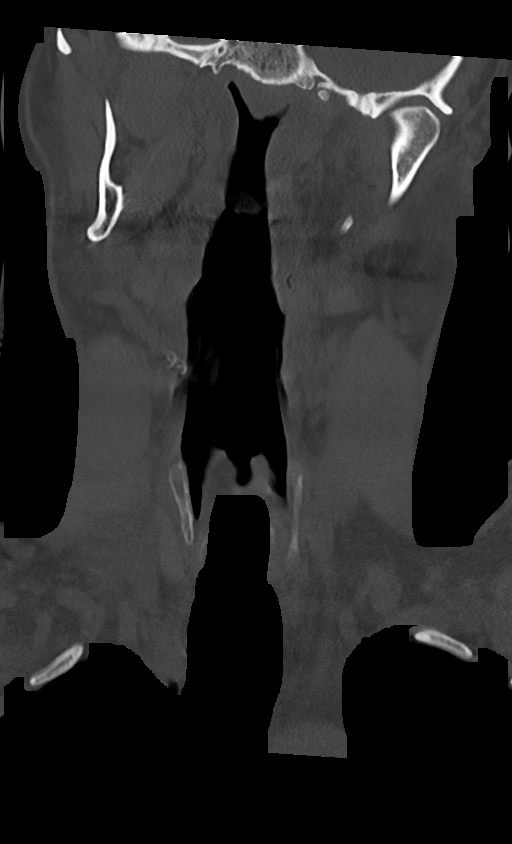
[im 28/69  bone]
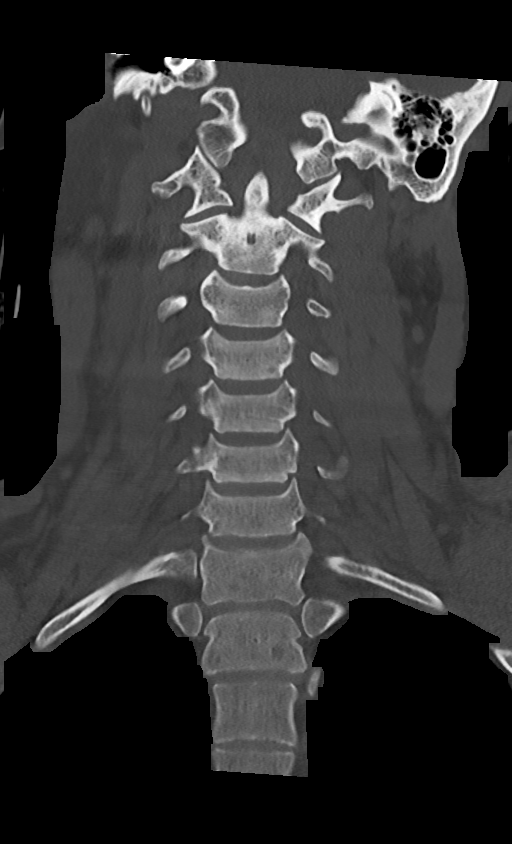
[im 41/69  bone]
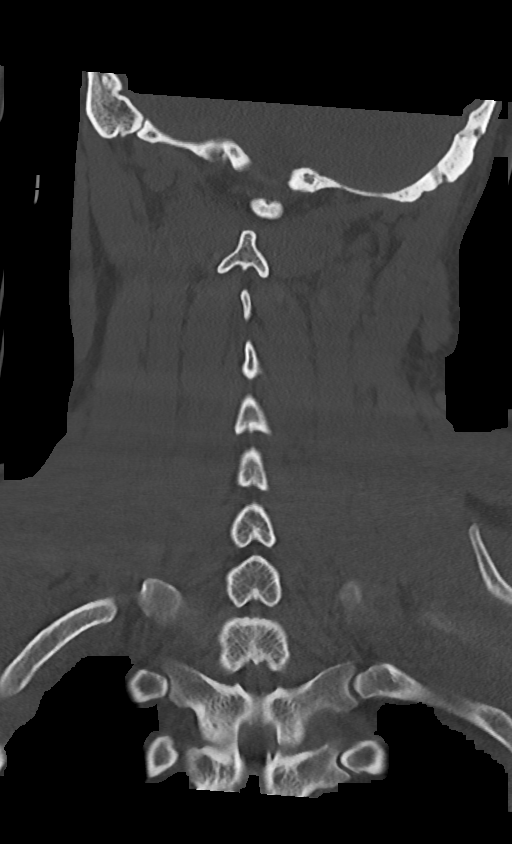

[Series 8: orthogonal axials · axial · 0.21mm/px · z∈[-459,-320]mm · 5 of 110 slices shown, 7 images]
[im 19/110  soft-tissue]
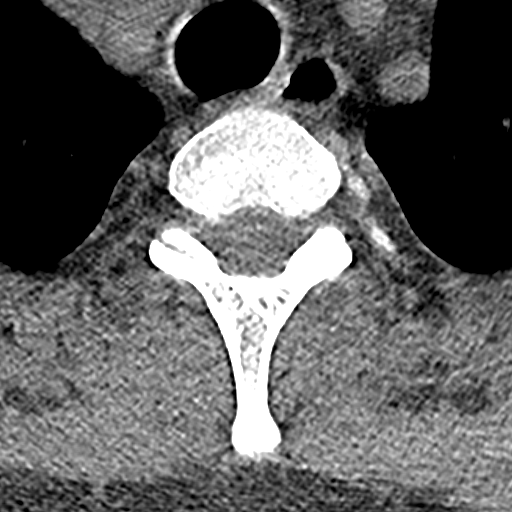
[im 19/110  bone]
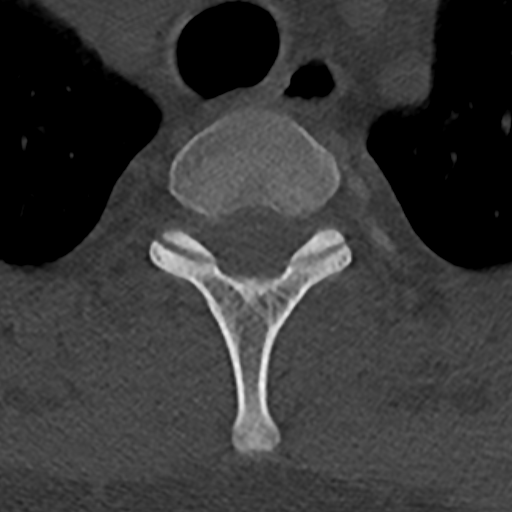
[im 37/110  bone]
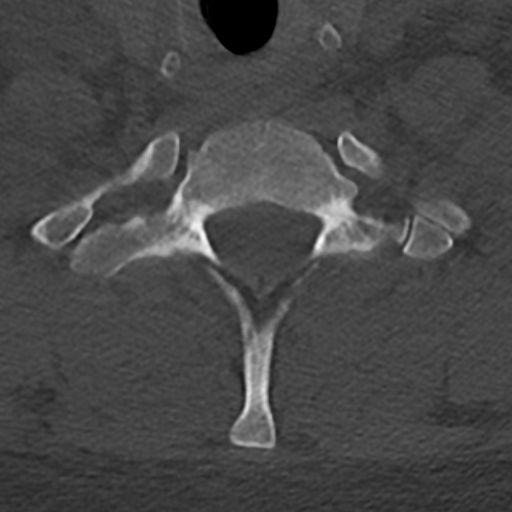
[im 55/110  bone]
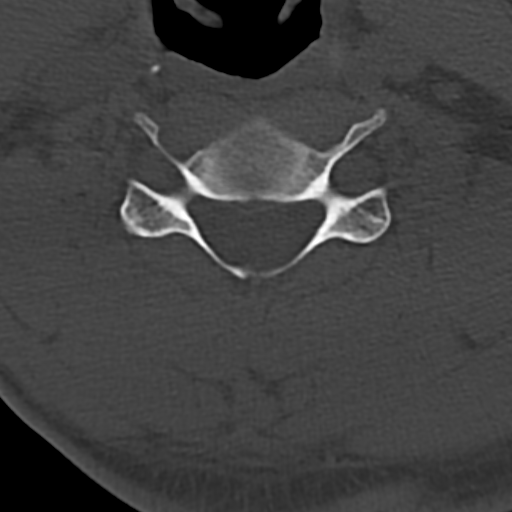
[im 73/110  bone]
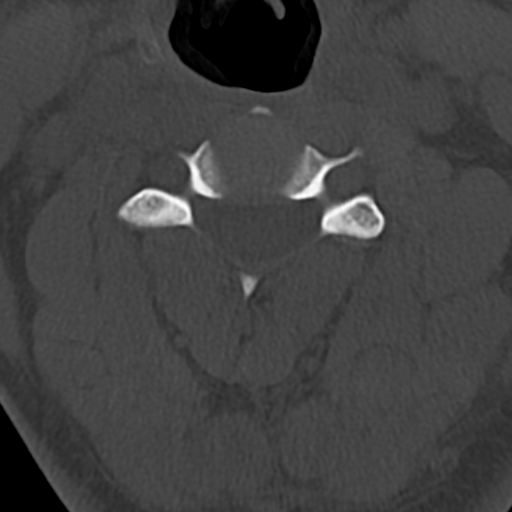
[im 91/110  soft-tissue]
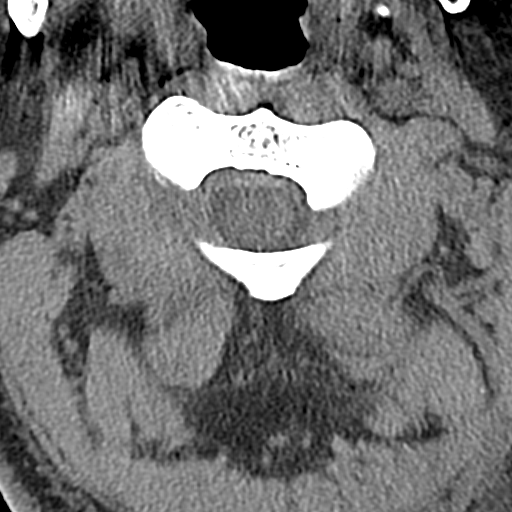
[im 91/110  bone]
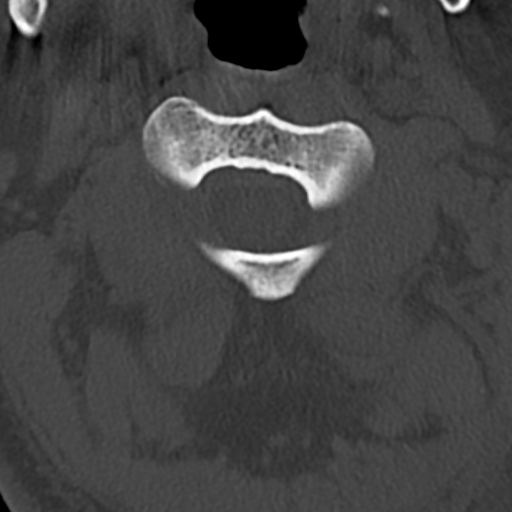

[13 of 33 positions shown; findings below may reference images not displayed]

FINDINGS: CT HEAD FINDINGS

Brain: No evidence of acute infarction, hemorrhage, hydrocephalus,
extra-axial collection or mass lesion/mass effect.

Vascular: No hyperdense vessel or unexpected calcification.

Skull: Prominent appearance of the lambdoid sutures is symmetric
bilaterally without adjacent stranding or other features suggest
diastasis. No definite calvarial fracture or suspicious osseous
lesion. No scalp swelling or hematoma.

Sinuses/Orbits: Nodular thickening right maxillary sinus paranasal
sinuses and mastoid air cells are otherwise predominantly clear. And
Orbital structures are unremarkable.

Other: None

None

CT CERVICAL SPINE FINDINGS

Alignment: Straightening of the normal cervical lordosis with slight
reversal centered at C5. No traumatic listhesis.

Skull base and vertebrae: No acute fracture. No primary bone lesion
or focal pathologic process.

Soft tissues and spinal canal: No pre or paravertebral fluid or
swelling. No visible canal hematoma.

Disc levels: Disc spaces are well preserved. No significant spinal
canal or foraminal stenosis.

Upper chest: Scattered foci of upper mediastinal gas. No visible
tracheal or esophageal injury.

Other: None.
IMPRESSION: No acute intracranial abnormality. No calvarial fracture or scalp
hematoma.

No acute cervical spine fracture or traumatic listhesis. Cervical
stabilization collar in place.

Scattered foci of upper mediastinal gas, for further details see
dedicated chest CT.

## 2020-10-17 IMAGING — DX PORTABLE CHEST - 1 VIEW
1 series · 1 of 1 positions shown · non-contrast
Comparison: 08/20/2018

CLINICAL DATA: Follow-up pneumothorax.

EXAM:
PORTABLE CHEST 1 VIEW

[chest]
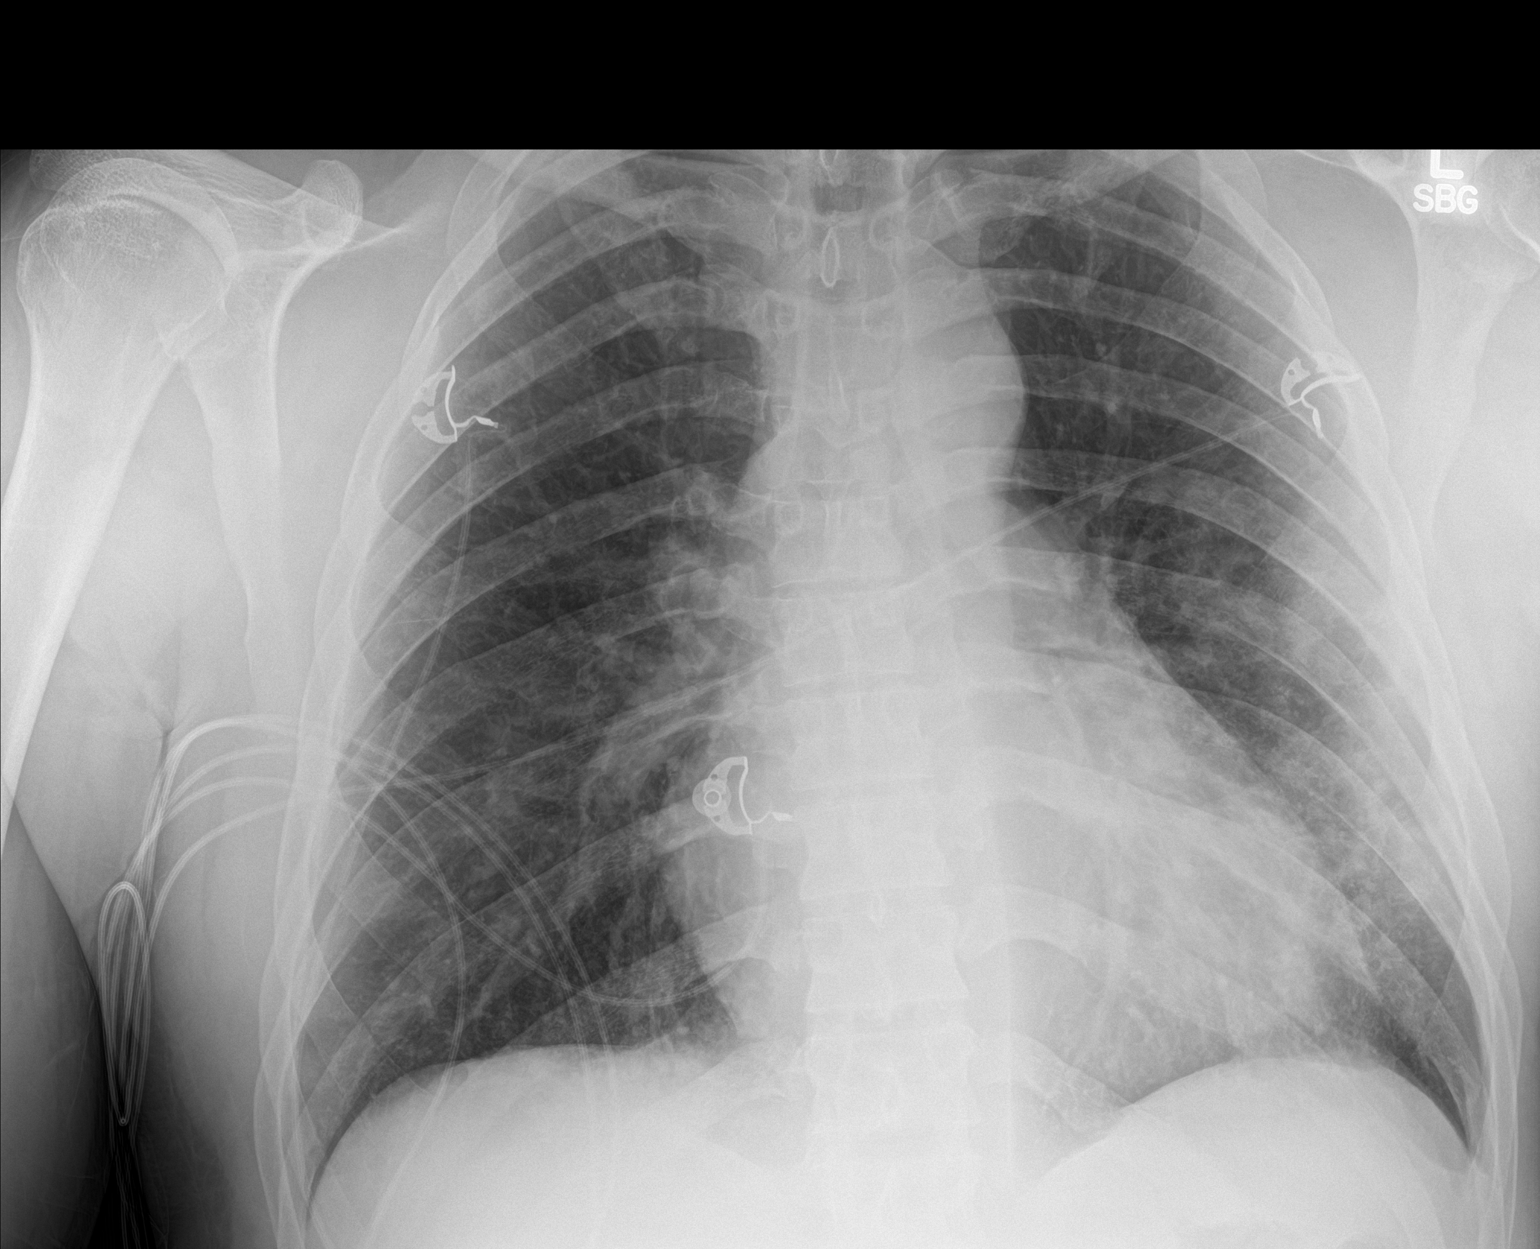

[1 of 1 positions shown; findings below may reference images not displayed]

FINDINGS: Persistent patchy bilateral pulmonary infiltrates left worse than
right consistent with pneumonia. Some radiographic worsening on the
left. No visible pneumothorax or pneumomediastinum by chest
radiography. Findings at CT were quite minimal. No pleural fluid. No
acute bone finding.
IMPRESSION: Persistent bilateral pulmonary infiltrates left worse than right
consistent with pneumonia. Slight worsening on the left. No visible
pneumothorax or pneumomediastinum by radiography.

## 2022-01-03 DIAGNOSIS — Z419 Encounter for procedure for purposes other than remedying health state, unspecified: Secondary | ICD-10-CM | POA: Diagnosis not present

## 2022-02-03 DIAGNOSIS — Z419 Encounter for procedure for purposes other than remedying health state, unspecified: Secondary | ICD-10-CM | POA: Diagnosis not present

## 2022-03-06 DIAGNOSIS — Z419 Encounter for procedure for purposes other than remedying health state, unspecified: Secondary | ICD-10-CM | POA: Diagnosis not present

## 2022-04-04 DIAGNOSIS — Z419 Encounter for procedure for purposes other than remedying health state, unspecified: Secondary | ICD-10-CM | POA: Diagnosis not present

## 2022-05-05 DIAGNOSIS — Z419 Encounter for procedure for purposes other than remedying health state, unspecified: Secondary | ICD-10-CM | POA: Diagnosis not present

## 2022-06-04 DIAGNOSIS — Z419 Encounter for procedure for purposes other than remedying health state, unspecified: Secondary | ICD-10-CM | POA: Diagnosis not present

## 2022-07-05 DIAGNOSIS — Z419 Encounter for procedure for purposes other than remedying health state, unspecified: Secondary | ICD-10-CM | POA: Diagnosis not present

## 2022-08-04 DIAGNOSIS — Z419 Encounter for procedure for purposes other than remedying health state, unspecified: Secondary | ICD-10-CM | POA: Diagnosis not present

## 2022-09-04 DIAGNOSIS — Z419 Encounter for procedure for purposes other than remedying health state, unspecified: Secondary | ICD-10-CM | POA: Diagnosis not present

## 2022-10-05 DIAGNOSIS — Z419 Encounter for procedure for purposes other than remedying health state, unspecified: Secondary | ICD-10-CM | POA: Diagnosis not present

## 2022-11-04 DIAGNOSIS — Z419 Encounter for procedure for purposes other than remedying health state, unspecified: Secondary | ICD-10-CM | POA: Diagnosis not present

## 2022-12-05 DIAGNOSIS — Z419 Encounter for procedure for purposes other than remedying health state, unspecified: Secondary | ICD-10-CM | POA: Diagnosis not present

## 2023-01-04 DIAGNOSIS — Z419 Encounter for procedure for purposes other than remedying health state, unspecified: Secondary | ICD-10-CM | POA: Diagnosis not present

## 2023-02-04 DIAGNOSIS — Z419 Encounter for procedure for purposes other than remedying health state, unspecified: Secondary | ICD-10-CM | POA: Diagnosis not present

## 2023-03-07 DIAGNOSIS — Z419 Encounter for procedure for purposes other than remedying health state, unspecified: Secondary | ICD-10-CM | POA: Diagnosis not present

## 2023-04-04 DIAGNOSIS — Z419 Encounter for procedure for purposes other than remedying health state, unspecified: Secondary | ICD-10-CM | POA: Diagnosis not present

## 2023-05-16 DIAGNOSIS — Z419 Encounter for procedure for purposes other than remedying health state, unspecified: Secondary | ICD-10-CM | POA: Diagnosis not present

## 2023-06-15 DIAGNOSIS — Z419 Encounter for procedure for purposes other than remedying health state, unspecified: Secondary | ICD-10-CM | POA: Diagnosis not present

## 2023-07-16 DIAGNOSIS — Z419 Encounter for procedure for purposes other than remedying health state, unspecified: Secondary | ICD-10-CM | POA: Diagnosis not present

## 2023-08-15 DIAGNOSIS — Z419 Encounter for procedure for purposes other than remedying health state, unspecified: Secondary | ICD-10-CM | POA: Diagnosis not present

## 2023-09-15 DIAGNOSIS — Z419 Encounter for procedure for purposes other than remedying health state, unspecified: Secondary | ICD-10-CM | POA: Diagnosis not present

## 2023-10-16 DIAGNOSIS — Z419 Encounter for procedure for purposes other than remedying health state, unspecified: Secondary | ICD-10-CM | POA: Diagnosis not present

## 2023-11-15 DIAGNOSIS — Z419 Encounter for procedure for purposes other than remedying health state, unspecified: Secondary | ICD-10-CM | POA: Diagnosis not present

## 2023-12-16 DIAGNOSIS — Z419 Encounter for procedure for purposes other than remedying health state, unspecified: Secondary | ICD-10-CM | POA: Diagnosis not present
